# Patient Record
Sex: Female | Born: 1975 | Race: Black or African American | Hispanic: No | Marital: Single | Smoking: Never smoker
Health system: Southern US, Community
[De-identification: ages and names within clinical notes are randomized; demographics above are authoritative.]

## PROBLEM LIST (undated history)

## (undated) DIAGNOSIS — I493 Ventricular premature depolarization: Secondary | ICD-10-CM

## (undated) DIAGNOSIS — I10 Essential (primary) hypertension: Secondary | ICD-10-CM

## (undated) DIAGNOSIS — F32A Depression, unspecified: Secondary | ICD-10-CM

## (undated) DIAGNOSIS — F419 Anxiety disorder, unspecified: Secondary | ICD-10-CM

## (undated) HISTORY — PX: OTHER SURGICAL HISTORY: SHX169

---

## 1999-01-23 ENCOUNTER — Emergency Department (HOSPITAL_COMMUNITY): Admission: EM | Admit: 1999-01-23 | Discharge: 1999-01-23 | Payer: Self-pay | Admitting: *Deleted

## 1999-01-23 ENCOUNTER — Encounter: Payer: Self-pay | Admitting: *Deleted

## 2001-01-27 ENCOUNTER — Other Ambulatory Visit: Admission: RE | Admit: 2001-01-27 | Discharge: 2001-01-27 | Payer: Self-pay | Admitting: Obstetrics and Gynecology

## 2001-05-25 ENCOUNTER — Inpatient Hospital Stay (HOSPITAL_COMMUNITY): Admission: AD | Admit: 2001-05-25 | Discharge: 2001-05-25 | Payer: Self-pay | Admitting: Obstetrics and Gynecology

## 2001-05-25 ENCOUNTER — Encounter: Payer: Self-pay | Admitting: Obstetrics and Gynecology

## 2001-09-06 ENCOUNTER — Inpatient Hospital Stay (HOSPITAL_COMMUNITY): Admission: AD | Admit: 2001-09-06 | Discharge: 2001-09-06 | Payer: Self-pay | Admitting: *Deleted

## 2001-09-06 ENCOUNTER — Inpatient Hospital Stay (HOSPITAL_COMMUNITY): Admission: AD | Admit: 2001-09-06 | Discharge: 2001-09-10 | Payer: Self-pay | Admitting: Obstetrics and Gynecology

## 2002-03-10 ENCOUNTER — Other Ambulatory Visit: Admission: RE | Admit: 2002-03-10 | Discharge: 2002-03-10 | Payer: Self-pay | Admitting: Obstetrics and Gynecology

## 2002-04-20 ENCOUNTER — Emergency Department (HOSPITAL_COMMUNITY): Admission: EM | Admit: 2002-04-20 | Discharge: 2002-04-21 | Payer: Self-pay | Admitting: Emergency Medicine

## 2004-01-12 ENCOUNTER — Other Ambulatory Visit: Admission: RE | Admit: 2004-01-12 | Discharge: 2004-01-12 | Payer: Self-pay | Admitting: Obstetrics and Gynecology

## 2004-03-28 ENCOUNTER — Ambulatory Visit (HOSPITAL_COMMUNITY): Admission: RE | Admit: 2004-03-28 | Discharge: 2004-03-28 | Payer: Self-pay | Admitting: Obstetrics and Gynecology

## 2004-04-25 ENCOUNTER — Inpatient Hospital Stay (HOSPITAL_COMMUNITY): Admission: AD | Admit: 2004-04-25 | Discharge: 2004-04-25 | Payer: Self-pay | Admitting: Obstetrics and Gynecology

## 2004-04-28 ENCOUNTER — Inpatient Hospital Stay (HOSPITAL_COMMUNITY): Admission: AD | Admit: 2004-04-28 | Discharge: 2004-04-28 | Payer: Self-pay | Admitting: Obstetrics and Gynecology

## 2004-07-29 ENCOUNTER — Inpatient Hospital Stay (HOSPITAL_COMMUNITY): Admission: AD | Admit: 2004-07-29 | Discharge: 2004-07-29 | Payer: Self-pay | Admitting: Obstetrics and Gynecology

## 2004-08-06 ENCOUNTER — Inpatient Hospital Stay (HOSPITAL_COMMUNITY): Admission: RE | Admit: 2004-08-06 | Discharge: 2004-08-09 | Payer: Self-pay | Admitting: Obstetrics and Gynecology

## 2005-01-14 ENCOUNTER — Other Ambulatory Visit: Admission: RE | Admit: 2005-01-14 | Discharge: 2005-01-14 | Payer: Self-pay | Admitting: Obstetrics and Gynecology

## 2005-02-01 ENCOUNTER — Emergency Department (HOSPITAL_COMMUNITY): Admission: EM | Admit: 2005-02-01 | Discharge: 2005-02-01 | Payer: Self-pay | Admitting: Emergency Medicine

## 2008-04-04 ENCOUNTER — Encounter: Payer: Self-pay | Admitting: Family Medicine

## 2008-04-04 ENCOUNTER — Ambulatory Visit: Payer: Self-pay | Admitting: Internal Medicine

## 2008-04-04 LAB — CONVERTED CEMR LAB
ALT: 14 units/L (ref 0–35)
AST: 15 units/L (ref 0–37)
Albumin: 4.5 g/dL (ref 3.5–5.2)
Alkaline Phosphatase: 66 units/L (ref 39–117)
BUN: 9 mg/dL (ref 6–23)
Basophils Absolute: 0 10*3/uL (ref 0.0–0.1)
Basophils Relative: 1 % (ref 0–1)
CO2: 20 meq/L (ref 19–32)
Calcium: 9.6 mg/dL (ref 8.4–10.5)
Chloride: 105 meq/L (ref 96–112)
Cholesterol: 173 mg/dL (ref 0–200)
Creatinine, Ser: 0.76 mg/dL (ref 0.40–1.20)
Eosinophils Absolute: 0.2 10*3/uL (ref 0.0–0.7)
Eosinophils Relative: 3 % (ref 0–5)
Glucose, Bld: 102 mg/dL — ABNORMAL HIGH (ref 70–99)
HCT: 42.6 % (ref 36.0–46.0)
HDL: 62 mg/dL (ref >39)
Hemoglobin: 14.1 g/dL (ref 12.0–15.0)
LDL Cholesterol: 96 mg/dL (ref 0–99)
Lymphocytes Relative: 39 % (ref 12–46)
Lymphs Abs: 2.8 10*3/uL (ref 0.7–4.0)
MCHC: 33.1 g/dL (ref 30.0–36.0)
MCV: 90.4 fL (ref 78.0–100.0)
Monocytes Absolute: 0.4 10*3/uL (ref 0.1–1.0)
Monocytes Relative: 6 % (ref 3–12)
Neutro Abs: 3.8 10*3/uL (ref 1.7–7.7)
Neutrophils Relative %: 53 % (ref 43–77)
Platelets: 261 10*3/uL (ref 150–400)
Potassium: 4.5 meq/L (ref 3.5–5.3)
RBC: 4.71 M/uL (ref 3.87–5.11)
RDW: 13.6 % (ref 11.5–15.5)
Sodium: 138 meq/L (ref 135–145)
TSH: 1.054 microintl units/mL (ref 0.350–4.50)
Total Bilirubin: 0.7 mg/dL (ref 0.3–1.2)
Total CHOL/HDL Ratio: 2.8
Total Protein: 7.5 g/dL (ref 6.0–8.3)
Triglycerides: 73 mg/dL (ref <150)
VLDL: 15 mg/dL (ref 0–40)
WBC: 7.2 10*3/uL (ref 4.0–10.5)

## 2008-04-09 ENCOUNTER — Emergency Department (HOSPITAL_COMMUNITY): Admission: EM | Admit: 2008-04-09 | Discharge: 2008-04-09 | Payer: Self-pay | Admitting: Emergency Medicine

## 2008-04-19 ENCOUNTER — Ambulatory Visit: Payer: Self-pay | Admitting: Internal Medicine

## 2008-04-19 ENCOUNTER — Ambulatory Visit: Payer: Self-pay | Admitting: *Deleted

## 2008-04-19 ENCOUNTER — Encounter: Payer: Self-pay | Admitting: Family Medicine

## 2008-08-05 ENCOUNTER — Ambulatory Visit: Payer: Self-pay | Admitting: Internal Medicine

## 2008-09-30 ENCOUNTER — Ambulatory Visit: Payer: Self-pay | Admitting: Internal Medicine

## 2008-11-11 ENCOUNTER — Encounter: Payer: Self-pay | Admitting: Family Medicine

## 2008-11-11 ENCOUNTER — Ambulatory Visit: Payer: Self-pay | Admitting: Internal Medicine

## 2008-11-28 ENCOUNTER — Telehealth (INDEPENDENT_AMBULATORY_CARE_PROVIDER_SITE_OTHER): Payer: Self-pay | Admitting: *Deleted

## 2008-11-29 ENCOUNTER — Ambulatory Visit: Payer: Self-pay

## 2008-11-30 ENCOUNTER — Ambulatory Visit: Payer: Self-pay

## 2008-11-30 ENCOUNTER — Encounter: Payer: Self-pay | Admitting: Cardiology

## 2009-07-10 ENCOUNTER — Ambulatory Visit: Payer: Self-pay | Admitting: Internal Medicine

## 2009-07-24 ENCOUNTER — Ambulatory Visit: Payer: Self-pay | Admitting: Family Medicine

## 2009-08-31 ENCOUNTER — Encounter (INDEPENDENT_AMBULATORY_CARE_PROVIDER_SITE_OTHER): Payer: Self-pay | Admitting: Adult Health

## 2009-08-31 ENCOUNTER — Ambulatory Visit: Payer: Self-pay | Admitting: Internal Medicine

## 2009-08-31 LAB — CONVERTED CEMR LAB
Bilirubin Urine: NEGATIVE
Chlamydia, Swab/Urine, PCR: NEGATIVE
GC Probe Amp, Urine: NEGATIVE
Leukocytes, UA: NEGATIVE
Protein, ur: NEGATIVE mg/dL
Specific Gravity, Urine: 1.026 (ref 1.005–1.030)
Urobilinogen, UA: 0.2 (ref 0.0–1.0)

## 2009-09-01 ENCOUNTER — Encounter (INDEPENDENT_AMBULATORY_CARE_PROVIDER_SITE_OTHER): Payer: Self-pay | Admitting: Adult Health

## 2010-12-07 NOTE — H&P (Signed)
[facility] [place]  Patient:    Briana Rodgers, Briana Rodgers Visit Number: [id number] MRN: [id number]          Service Type: OBS Location: [facility] Attending Physician:  Esmeralda Arthur Dictated by:   Wynelle Bourgeois, CNM Admit Date:  09/06/2001                           History and Physical  HISTORY OF PRESENT ILLNESS:   This is a 35 year old G1, P0, at 40-5/7 weeks, who presented last night for early labor with cervix dilated to fingertip. She was given analgesia during the night and is now 3-4 cm dilated with stronger labor pattern.  Pregnancy has been followed by the nurse midwife service and remarkable for:  (1) Equivocal rubella; (2) family history of twins; (3) family history of Huntingtons chorea; (4) father of baby with HSV; (5) group B strep negative.  OBSTETRICAL HISTORY:          The patient is a primigravida.  PRENATAL LABORATORY DATA:     Hemoglobin 12.8, hematocrit 37.3, platelets 256. Blood type O positive, antibody screen negative.  Sickle cell trait negative. RPR nonreactive.  Rubella equivocal HBsAG negative.  HIV nonreactive.  Pap test normal.  Gonorrhea negative, Chlamydia negative.  AFP free beta within normal limits.  Glucose challenge within normal limits.  Group B strep negative.  PAST MEDICAL HISTORY:         Remarkable for a history of an episode of heart palpitations, which was evaluated by Dr. Clarene Duke and found to be within normal limits.  History of being lactose-intolerant.  Patient is a previous smoker. She stopped in 1997.  She also stopped alcohol and drug use at that time.  FAMILY HISTORY:               Father of the baby has HSV.  The patient has never had symptoms.  She has a mother with varicosities, a cousin with kidney failure, a maternal grandfather with unknown type of cancer.  GENETIC HISTORY:              Remarkable for Huntingtons chorea on the father of the babys side with his aunt and maternal grandfather.  History of  twins in paternal grandmother, paternal uncle, and cousin all have twins.  SOCIAL HISTORY:               The patient is single but involved with Jearld Fenton, IV, who is involved and supportive.  She is a Futures trader.  She is of the [employer].  She denies any alcohol, tobacco, or drug use currently.  PHYSICAL EXAMINATION:  VITAL SIGNS:                  Stable, afebrile.  HEENT:                        Within normal limits.  NECK:                         Thyroid normal, not enlarged.  BREASTS:                      Soft, nontender, no masses.  CHEST:                        Clear to auscultation bilaterally.  CARDIAC:  Regular rate and rhythm without murmur.  ABDOMEN:                      Gravid at 40 cm, vertex by Leopolds.  Maternal fetal monitor denotes heart rate in the 140s with average variability, positive accelerations, which meets criteria for reactivity. Uterine contractions every three minutes, which are strong.  PELVIC:                       Cervical exam is 3-4 cm, 80% effaced, and -1 station with a vertex presentation and positive bloody show.  Intact membranes.  EXTREMITIES:                  Within normal limits.  Negative Homans.  ASSESSMENT:                   1. Intrauterine pregnancy at term.                               2. Active labor.                               3. Group B strep negative.  PLAN:                         1. Admit to birthing suite, Dr. Stefano Gaul                                  notified.                               2. Routine CNM orders.                               3. Epidural p.r.n. Dictated by:   Wynelle Bourgeois, CNM Attending Physician:  Esmeralda Arthur DD:  09/07/01 TD:  09/07/01 Job: [id number]

## 2010-12-07 NOTE — Op Note (Signed)
[facility] [place]  Patient:    Briana Rodgers, Briana Rodgers Visit Number: [id number] MRN: [id number]          Service Type: OBS Location: [facility] Attending Physician:  Esmeralda Arthur Dictated by:   Janine Limbo, M.D. Proc. Date: 09/07/01 Admit Date:  09/06/2001 Discharge Date: 09/06/2001                             Operative Report  PREOPERATIVE DIAGNOSES:       1. Term intrauterine pregnancy.                               2. Nonreassuring fetal heart rate tracing.                               3. Meconium-stained amniotic fluid.  POSTOPERATIVE DIAGNOSES:      1. Term intrauterine pregnancy.                               2. Nonreassuring fetal heart rate tracing.                               3. Meconium-stained amniotic fluid.  OPERATION:                    Primary low transverse cesarean section.  SURGEON:                      Janine Limbo, M.D.  FIRST ASSISTANT:              Wynelle Bourgeois, CNM.  ANESTHESIA:                   Spinal.  DISPOSITION:                  The patient is a 35 year old female, gravida 1, para 0, who presented to [facility] in labor.  She was noted to have meconium-stained amniotic fluid.  She dilated her cervix to 4-5 cm, but progressed no further.  The patient began having late decelerations.  An amnioinfusion was given.  The patient was given oxygen and positioned from side-to-side.  The decelerations resolved initially.  The patient was started on Pitocin to augment her labor.  The decelerations returned.  The decision was made to proceed with cesarean section.  The risks and benefits of cesarean section were reviewed including, but not limited to anesthetic complications, bleeding, infection, and possible damage to the surrounding organs.  FINDINGS:                     A 6 pounds and 14 ounces female infant Kathryne Hitch). The Apgars were 8 at one minute and 9 at five minutes.  The uterus was normal for the gravid  state.  There was a nuchal cord present.  DESCRIPTION OF PROCEDURE:     The patient was taken to the operating room where it was determined that the epidural (actually a spinal) that she had received for labor would be adequate for cesarean section.  The patients abdomen was prepped with multiple layers of Betadine and then sterilely draped.  A low transverse incision was made in the  abdomen and carried sharply through the subcutaneous tissue, the fascia, and the anterior peritoneum.  An incision was made in the lower uterine segment and extended transversely.  The fetal head was delivered.  The mouth and nose were suctioned using the DeLee trap and the bulb suction.  There was no meconium noted beneath the vocal cords.  There was a nuchal cord present.  The nuchal cord was clamped and cut, and then the remainder of the infant was delivered and carried immediately to the pediatric team in attendance.  The vocal cords were not visualized because the infant did cry.  Routine cord blood studies were obtained.  The placenta was removed.  The uterine cavity was cleaned of amniotic fluid, clotted blood, and membranes.  The uterine cavity and the pelvic cavity were then vigorously irrigated.  The uterine incision was closed using a running locking suture of 2-0 Vicryl.  Hemostasis was adequate.  Again, the peritoneal cavity was irrigated.  Hemostasis was adequate.  All instruments were removed.  The anterior peritoneum and the abdominal musculature were reapproximated in the midline.  The fascia was irrigated and then closed using a running suture of 0 Vicryl followed by three interrupted sutures of 0 Vicryl.  The subcutaneous layer was irrigated and then closed using a running suture of 2-0 Vicryl.  The skin was reapproximated using skin staples.  Sponge, needle, and instrument counts were correct on two occasions.  The estimated blood loss for the procedure was 700 cc.  The patient tolerated  her procedure well.  The patient was taken to the recovery room in stable condition.  The infant was taken to the full-term nursery in stable condition. Dictated by:   Janine Limbo, M.D. Attending Physician:  Esmeralda Arthur DD:  09/07/01 TD:  09/08/01 Job: [id number]

## 2010-12-07 NOTE — Discharge Summary (Signed)
[facility]  Patient:    Briana Rodgers, Briana Rodgers Visit Number: [id number] MRN: [id number]          Service Type: Attending:  Silverio Lay, M.D. Dictated by:   Wynelle Bourgeois, CNM Adm. Date:  09/07/01 Disc. Date: 09/10/01                             Discharge Summary  ADMITTING DIAGNOSES:          1. Intrauterine pregnancy at 40-5/7th weeks.                               2. Active labor.                               3. Group B strep negative.  DISCHARGE DIAGNOSES:          1. Intrauterine pregnancy at 40-5/7th weeks.                               2. Active labor.                               3. Group B strep negative.                               4. Meconium stained amniotic fluid.                               5. Nonreassuring fetal heart rate tracing.  PROCEDURES:                   1. Spinal anesthesia.                               2. Primary low transverse cesarean section for                                  a viable female infant named Lundyn weighing                                  at 6 pounds 4 ounces.  Apgars 8 and 9.  HOSPITAL COURSE:              The patient was admitted for active labor at 3 to 4 cm.  She began having spontaneous variable decelerations down to the 60s with return to baseline after position changes.  IUPC and ISC were inserted.  Amniotomy was performed for thick, meconium stained amniotic fluid and amniotic fusion was begun.  Cervix progressed to 4 cm, 90%, -1 station.  The patient received continuous spinal anesthesia for pain management and continued to be observed.  She continued to experience variable decelerations and developed late decelerations and progressed her cervix to 4 to 5 cm.  Observation was undertaken for 30 minutes and she continued to have a nonreassuring fetal heart rate tracing and no change in her cervical exam.  Options  were discussed with the patient and her family and decision was made to proceed  with an elective low transverse cesarean section which was performed by Dr. Janine Limbo under spinal anesthesia.  Estimated blood loss was 700 cc.  The patient and infant did well.  On postoperative day one, she was recovering well and had good pain relief with medications.  On postoperative day three, she was doing well, breast-feeding.  The small lochia incision was dry.  Lungs were clear.  Heart rate was regular rate and rhythm and she was deemed to have received the full benefit of her hospital stay and was discharged home on postoperative day three.  DISCHARGE MEDICATIONS:        1. Motrin 600 mg p.o. q.6h. p.r.n.                               2. Tylox 1-2 p.o. q.3-4h. p.r.n.  LABORATORY DATA:              White blood cell count 14.5, hemoglobin 10.9, hematocrit 32.3, platelets 181,000.  DISCHARGE INSTRUCTIONS:       Per [facility] OB handout.  Discharge follow-up in four weeks for cultures and then two weeks later for IUD insertion or p.r.n. as indicated. Dictated by:   Wynelle Bourgeois, CNM Attending:  Silverio Lay, M.D. DD:  09/10/01 TD:  09/10/01 Job: [id number]

## 2010-12-07 NOTE — Op Note (Signed)
NAMEKANDISS, IHRIG                  ACCOUNT NO.:  [id number]   MEDICAL RECORD NO.:  [id number]          PATIENT TYPE:  INP   LOCATION:  9135                          FACILITY:  [facility]   PHYSICIAN:  Hal Morales, M.D.DATE OF BIRTH:  [birth year]   DATE OF PROCEDURE:  08/06/2004  DATE OF DISCHARGE:                                 OPERATIVE REPORT   PREOPERATIVE DIAGNOSES:  1.  Intrauterine pregnancy at term.  2.  Prior cesarean section, desire for repeat cesarean section.   POSTOPERATIVE DIAGNOSES:  1.  Intrauterine pregnancy at term.  2.  Prior cesarean section, desire for repeat cesarean section.   OPERATION:  Repeat low transverse cesarean section.   SURGEON:  Hal Morales, M.D.   FIRST ASSISTANT:  Saverio Danker, certified nurse midwife.   ANESTHESIA:  Spinal.   ESTIMATED BLOOD LOSS:  750 cc.   COMPLICATIONS:  None.   FINDINGS:  The uterus, tubes and ovaries were normal for the gravid state.  The patient was delivered of a female infant, whose name is Merlyn Albert, weighing 8  pounds 10 ounces, with Apgars of 7 and 9 at one and five minutes,  respectively.   PROCEDURE:  The patient is taken to the operating room after appropriate  identification and placed on the operating table.  After placement of a  spinal anesthetic, she was placed in the supine position with a left lateral  tilt.  The abdomen and perineum were prepped with multiple layers with  Betadine and a Foley catheter inserted into the bladder and connected to  straight drainage.  After assurance of adequate anesthesia, the suprapubic  region was infiltrated with a total of 20 cc of 0.25% Marcaine.  The uterine  incision was made at the site of the previous cesarean section incision and  the abdomen opened in layers.  The peritoneum was entered and the bladder  blade placed.  The uterine serosa was incised and that incision taken  laterally on either side sharply.  The bladder was bluntly dissected off the  anterior uterus and the uterine incision made in the low transverse  position, then extended bluntly to either side.  The membranes were ruptured  with clear fluid and a small amount of particulate meconium.  The infant was  then delivered from the occiput transverse position with the aid of a Kiwi  vacuum extractor and after having the nares and pharynx suctioned and the  cord clamped and cut was handed off to the awaiting pediatricians.  The  appropriate cord blood was drawn and the placenta noted to have separated  from the uterus and was removed from the operative field.  The uterine  incision was closed with a running interlocking suture of 0 Vicryl.  An  imbricating suture of 0 Vicryl was placed and hemostasis noted to be  adequate.  Copious irrigation was carried out.  The abdominal peritoneum was  closed with a running suture of 2-0 Vicryl.  The rectus muscles were  reapproximated in the midline with figure-of-eight suture of 2-0 Vicryl.  The rectus muscles were made hemostatic  with Bovie cautery and irrigated.  The rectus fascia was closed with a running suture of 0 Vicryl, then  reinforced on either side of midline with figure-of-eight sutures of 0  Vicryl.  The subcutaneous tissue was copiously irrigated and made hemostatic  with Bovie cautery.  A subcutaneous Jackson-Pratt drain was placed through a  stab wound in the left lower quadrant and tied in with a suture of 2-0 silk.  The skin incision was closed with a subcuticular suture of 3-0 Monocryl.  A  sterile dressing was applied to the incision and the patient taken from the  operating room to the recovery room in satisfactory condition, having  tolerated the procedure well with sponge and instrument counts correct.  The  infant went to the full-term nursery.     Colin Ina   VPH/MEDQ  D:  08/06/2004  T:  08/06/2004  Job:  [id number]

## 2010-12-07 NOTE — Discharge Summary (Signed)
Briana Rodgers                  ACCOUNT NO.:  [id number]   MEDICAL RECORD NO.:  [id number]          PATIENT TYPE:  INP   LOCATION:  9135                          FACILITY:  [facility]   PHYSICIAN:  Hal Morales, M.D.DATE OF BIRTH:  16-Jan-1976   DATE OF ADMISSION:  08/06/2004  DATE OF DISCHARGE:  08/09/2004                                 DISCHARGE SUMMARY   ADMISSION DIAGNOSES:  1.  Intrauterine pregnancy at term.  2.  Previous cesarean section.  3.  Desiring repeat.   DISCHARGE DIAGNOSES:  1.  Intrauterine pregnancy at term.  2.  Previous cesarean section.  3.  Desiring repeat.  4.  Breast-feeding undecided.   PLAN:  Oral contraceptives for contraception.   PROCEDURES THIS ADMISSION:  1.  Repeat low transverse cesarean section for delivery of a viable female      infant named Briana Rodgers, who had Apgars 7 and 9 and weighed 8 pounds 10 ounces      on August 06, 2004, attended in delivery by Dr. Dierdre Forth and      Saverio Danker, C.N.M.   HOSPITAL COURSE:  Briana Rodgers is a 35 year old single black female, gravida 2,  para 1-0-0-1, at term admitted for elective repeat cesarean section  secondary to history of previous cesarean section and underwent the same  without complication for delivery of a viable female infant named Briana Rodgers, who  weighed 8 pounds 10 ounces and had Apgars of 7 and 9 on August 06, 2004,  attended in delivery by Dr. Dierdre Forth and Vance Gather Duplantis, C.N.M.  Please see operative note for further details.   Postoperatively, the patient is doing well.  She is ambulating, voiding, and  eating without difficulty.  Her vital signs are stable and she has been  afebrile throughout her postoperative course.  She is breast-feeding also  without difficulty.  She plans oral contraceptives for birth control at this  time, though is considering the patch for later on.  She is deemed ready for  discharge today.  Her discharge instructions are as per the [facility]  OB/GYN hand-out.   DISCHARGE MEDICATIONS:  1.  Motrin 600 mg p.o. q.6h. p.r.n. pain.  2.  Tylox 1-2 p.o. q.4-6h. p.r.n. pain.  3.  Prenatal vitamins daily.   DISCHARGE LABORATORY:  Hemoglobin 12.2, WBC 11.8, platelets 205.   DISCHARGE FOLLOWUP:  In 4-6 weeks at [facility] or p.r.n.   DISCHARGE STATUS:  Well and stable.     Shel   SJD/MEDQ  D:  08/09/2004  T:  08/09/2004  Job:  [id number]

## 2010-12-07 NOTE — H&P (Signed)
Briana Rodgers, BALLMAN NO.:  [id number]   MEDICAL RECORD NO.:  [id number]          PATIENT TYPE:  INP   LOCATION:  9135                          FACILITY:  [facility]   PHYSICIAN:  Hal Morales, M.D.DATE OF BIRTH:  11-10-1975   DATE OF ADMISSION:  08/06/2004  DATE OF DISCHARGE:                                HISTORY & PHYSICAL   Ms. Briana Rodgers is a 35 year old single black female, gravida 2, para 1-0-0-1 at  term who presents for elective repeat Cesarean section. She denies any  leaking or vaginal bleeding. She denies any nausea, vomiting, headache, or  visual disturbances. Her pregnancy has been followed at [facility] and has been essentially uncomplicated thought at risk for previous  Cesarean section and originally desiring trial of labor but now desiring  elective repeat Cesarean section. 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   OBSTETRICAL/GYNECOLOGICAL HISTORY:  She is a gravida 2, para 1-0-0-1 who  delivered a viable female infant in February of 2003 who weighed 6 pounds 4  ounces at [redacted] weeks gestation. She had a primary low transverse Cesarean  section for that delivery secondary to nonreassuring fetal heart rate,  meconium stained fluid, and failure to progress beyond 4 cm.   GENERAL MEDICAL HISTORY:  She is allergic to SULFA. She reports having had  the usual childhood diseases. She reports a history of lactose intolerance,  history of smoking in the past but discontinued that in 1997, and she also  has a history of occasional palpitations, but her workup was within normal  limits.   FAMILY HISTORY:  Significant for mom with varicosities, maternal grandfather  with some kind of cancer, and a cousin on dialysis. Her genetic history is  negative, other there is a family history of Huntington's chorea.   SOCIAL HISTORY:  She is single. The father of the baby is Jearld Fenton IV who  is involved and  supportive. 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 They denied illicit drug use, alcohol, or  smoking with this pregnancy.   PRENATAL LABORATORY DATA:  Her blood type is O positive. Her antibody screen  is negative. Sickle cell trait is negative. Syphilis is nonreactive. Rubella  is immune. Hepatitis B surface antigen is negative. HIV is negative. Cystic  fibrosis is negative. 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 GC and Chlamydia are both negative, and her Pap was within normal  limits in June of 2005. Her 1-hour Glucola was within normal range at 75,  and 36-week beta strep was negative.   PHYSICAL EXAMINATION:  VITAL SIGNS:  Her vital signs are stable. She is  afebrile.  HEENT:  Grossly within normal limits.  HEART:  Regular rate and rhythm.  CHEST:  Clear.  BREASTS:  Soft and nontender.  ABDOMEN:  Gravid with irregular mild uterine contractions. Fetal heart rate  is dopplered within the normal range.  PELVIC:  On Friday, her cervix was long and closed.  EXTREMITIES:  Within normal limits.   ASSESSMENT:  1.  Intrauterine pregnancy at term.  2.  Previous Cesarean section desiring repeat.   PLAN:  Admit to [facility] for repeat Cesarean section per Dr. Dierdre Forth.     Shel   SJD/MEDQ  D:  08/06/2004  T:  08/06/2004  Job:  [id number]

## 2012-09-13 ENCOUNTER — Emergency Department (HOSPITAL_COMMUNITY): Payer: BC Managed Care – PPO

## 2012-09-13 ENCOUNTER — Emergency Department (HOSPITAL_COMMUNITY)
Admission: EM | Admit: 2012-09-13 | Discharge: 2012-09-13 | Disposition: A | Discharge status: Home / Self Care | Payer: BC Managed Care – PPO | Attending: Emergency Medicine | Admitting: Emergency Medicine

## 2012-09-13 DIAGNOSIS — Z79899 Other long term (current) drug therapy: Secondary | ICD-10-CM | POA: Insufficient documentation

## 2012-09-13 DIAGNOSIS — S20219A Contusion of unspecified front wall of thorax, initial encounter: Secondary | ICD-10-CM | POA: Insufficient documentation

## 2012-09-13 DIAGNOSIS — Y92410 Unspecified street and highway as the place of occurrence of the external cause: Secondary | ICD-10-CM | POA: Insufficient documentation

## 2012-09-13 DIAGNOSIS — Y9389 Activity, other specified: Secondary | ICD-10-CM | POA: Insufficient documentation

## 2012-09-13 LAB — POCT I-STAT TROPONIN I: Troponin i, poc: 0 ng/mL (ref 0.00–0.08)

## 2012-09-13 LAB — PREGNANCY, URINE: Preg Test, Ur: NEGATIVE

## 2012-09-13 MED ORDER — IBUPROFEN 600 MG PO TABS: 600.0000 mg | ORAL_TABLET | Freq: Four times a day (QID) | ORAL | Status: DC | PRN

## 2012-09-13 MED ORDER — METHOCARBAMOL 500 MG PO TABS: 500.0000 mg | ORAL_TABLET | Freq: Two times a day (BID) | ORAL | Status: DC

## 2012-09-13 MED ORDER — MORPHINE SULFATE 4 MG/ML IJ SOLN: 4.0000 mg | Freq: Once | INTRAMUSCULAR | Status: DC

## 2012-09-13 MED ORDER — HYDROCODONE-ACETAMINOPHEN 5-325 MG PO TABS: 1.0000 | ORAL_TABLET | Freq: Four times a day (QID) | ORAL | Status: DC | PRN

## 2012-09-13 MED ORDER — OXYCODONE-ACETAMINOPHEN 5-325 MG PO TABS
1.0000 | ORAL_TABLET | Freq: Once | ORAL | Status: AC
  Administered 2012-09-13: 1 via ORAL
  Filled 2012-09-13: qty 1

## 2012-09-13 NOTE — ED Notes (Signed)
PT denies pain to neck/back. Denies LOC. Reports 6/10 right sided pain to chest that "hurts when I take a deep breath." AO x 4.

## 2012-09-13 NOTE — ED Notes (Addendum)
Pt ambulated with no difficulty
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
## 2012-09-13 NOTE — ED Notes (Signed)
MD Nanavati at the bedside  
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
## 2012-09-14 ENCOUNTER — Other Ambulatory Visit: Payer: Self-pay

## 2015-03-20 ENCOUNTER — Other Ambulatory Visit: Payer: Self-pay | Admitting: Surgical Oncology

## 2015-03-20 DIAGNOSIS — K219 Gastro-esophageal reflux disease without esophagitis: Secondary | ICD-10-CM

## 2015-03-28 ENCOUNTER — Other Ambulatory Visit: Payer: Self-pay

## 2015-04-06 ENCOUNTER — Ambulatory Visit
Admission: RE | Admit: 2015-04-06 | Discharge: 2015-04-06 | Disposition: A | Discharge status: Home / Self Care | Payer: BLUE CROSS/BLUE SHIELD | Source: Ambulatory Visit | Attending: Surgical Oncology | Admitting: Surgical Oncology

## 2015-04-06 DIAGNOSIS — K219 Gastro-esophageal reflux disease without esophagitis: Secondary | ICD-10-CM

## 2015-04-07 ENCOUNTER — Other Ambulatory Visit: Payer: Self-pay

## 2017-02-13 ENCOUNTER — Ambulatory Visit (HOSPITAL_COMMUNITY)
Admission: EM | Admit: 2017-02-13 | Discharge: 2017-02-13 | Disposition: A | Discharge status: Home / Self Care | Payer: BLUE CROSS/BLUE SHIELD

## 2017-02-13 ENCOUNTER — Encounter (HOSPITAL_COMMUNITY): Payer: Self-pay

## 2017-02-13 DIAGNOSIS — M7918 Myalgia, other site: Secondary | ICD-10-CM

## 2017-02-13 DIAGNOSIS — S161XXA Strain of muscle, fascia and tendon at neck level, initial encounter: Secondary | ICD-10-CM

## 2017-02-13 DIAGNOSIS — M791 Myalgia: Secondary | ICD-10-CM

## 2017-02-13 HISTORY — DX: Essential (primary) hypertension: I10

## 2017-02-13 MED ORDER — CYCLOBENZAPRINE HCL 10 MG PO TABS: 10.0000 mg | ORAL_TABLET | Freq: Two times a day (BID) | ORAL | 0 refills | Status: DC | PRN

## 2017-02-13 MED ORDER — DICLOFENAC SODIUM 75 MG PO TBEC: 75.0000 mg | DELAYED_RELEASE_TABLET | Freq: Two times a day (BID) | ORAL | 0 refills | Status: DC
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
## 2017-02-13 NOTE — Discharge Instructions (Signed)
You most likely have a strained muscle in your shoulder and neck. I have prescribed two medicines for your pain. The first is diclofenac, take 1 tablet twice a day and the other is Robaxin, take 1 tablet twice a day. Robaxin may cause drowsiness so do not drive until you know how this medicine affects you. Also do not drink any alcohol either. You may apply ice and alternate with heat for 15 minutes at a time 4 times daily and for additional pain control you may take tylenol over the counter ever 4 hours but do not take more than 4000 mg a day. Should your pain continue or fail to resolve, follow up with your primary care provider or return to clinic as needed.

## 2017-02-24 ENCOUNTER — Other Ambulatory Visit (HOSPITAL_COMMUNITY): Payer: Self-pay | Admitting: Internal Medicine

## 2017-02-24 ENCOUNTER — Ambulatory Visit (HOSPITAL_COMMUNITY)
Admission: RE | Admit: 2017-02-24 | Discharge: 2017-02-24 | Disposition: A | Discharge status: Home / Self Care | Payer: No Typology Code available for payment source | Source: Ambulatory Visit | Attending: Internal Medicine | Admitting: Internal Medicine

## 2017-02-24 DIAGNOSIS — M545 Low back pain: Secondary | ICD-10-CM

## 2017-02-24 DIAGNOSIS — M25511 Pain in right shoulder: Secondary | ICD-10-CM | POA: Diagnosis present

## 2017-02-24 DIAGNOSIS — M542 Cervicalgia: Secondary | ICD-10-CM

## 2017-02-24 DIAGNOSIS — M47812 Spondylosis without myelopathy or radiculopathy, cervical region: Secondary | ICD-10-CM | POA: Insufficient documentation

## 2017-02-24 DIAGNOSIS — M5137 Other intervertebral disc degeneration, lumbosacral region: Secondary | ICD-10-CM | POA: Diagnosis not present

## 2017-02-24 DIAGNOSIS — M50322 Other cervical disc degeneration at C5-C6 level: Secondary | ICD-10-CM | POA: Insufficient documentation
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
## 2018-10-15 DIAGNOSIS — F419 Anxiety disorder, unspecified: Secondary | ICD-10-CM | POA: Diagnosis not present

## 2018-10-15 DIAGNOSIS — E785 Hyperlipidemia, unspecified: Secondary | ICD-10-CM | POA: Diagnosis not present

## 2018-10-15 DIAGNOSIS — I10 Essential (primary) hypertension: Secondary | ICD-10-CM | POA: Diagnosis not present

## 2018-10-15 DIAGNOSIS — R002 Palpitations: Secondary | ICD-10-CM | POA: Diagnosis not present

## 2018-10-30 DIAGNOSIS — B373 Candidiasis of vulva and vagina: Secondary | ICD-10-CM | POA: Diagnosis not present

## 2019-03-22 DIAGNOSIS — Z79899 Other long term (current) drug therapy: Secondary | ICD-10-CM | POA: Diagnosis not present

## 2019-03-22 DIAGNOSIS — R51 Headache: Secondary | ICD-10-CM | POA: Diagnosis not present

## 2019-03-22 DIAGNOSIS — Z0001 Encounter for general adult medical examination with abnormal findings: Secondary | ICD-10-CM | POA: Diagnosis not present

## 2019-03-22 DIAGNOSIS — I10 Essential (primary) hypertension: Secondary | ICD-10-CM | POA: Diagnosis not present

## 2019-04-12 DIAGNOSIS — Z0001 Encounter for general adult medical examination with abnormal findings: Secondary | ICD-10-CM | POA: Diagnosis not present

## 2019-04-12 DIAGNOSIS — E785 Hyperlipidemia, unspecified: Secondary | ICD-10-CM | POA: Diagnosis not present

## 2019-04-12 DIAGNOSIS — Z79899 Other long term (current) drug therapy: Secondary | ICD-10-CM | POA: Diagnosis not present

## 2019-04-12 DIAGNOSIS — E559 Vitamin D deficiency, unspecified: Secondary | ICD-10-CM | POA: Diagnosis not present

## 2019-06-28 DIAGNOSIS — Z20828 Contact with and (suspected) exposure to other viral communicable diseases: Secondary | ICD-10-CM | POA: Diagnosis not present

## 2019-07-30 DIAGNOSIS — Z20828 Contact with and (suspected) exposure to other viral communicable diseases: Secondary | ICD-10-CM | POA: Diagnosis not present

## 2019-08-12 DIAGNOSIS — Z03818 Encounter for observation for suspected exposure to other biological agents ruled out: Secondary | ICD-10-CM | POA: Diagnosis not present

## 2019-08-12 DIAGNOSIS — Z20828 Contact with and (suspected) exposure to other viral communicable diseases: Secondary | ICD-10-CM | POA: Diagnosis not present

## 2019-09-13 DIAGNOSIS — E559 Vitamin D deficiency, unspecified: Secondary | ICD-10-CM | POA: Diagnosis not present

## 2019-09-13 DIAGNOSIS — I10 Essential (primary) hypertension: Secondary | ICD-10-CM | POA: Diagnosis not present

## 2019-09-13 DIAGNOSIS — J209 Acute bronchitis, unspecified: Secondary | ICD-10-CM | POA: Diagnosis not present

## 2019-09-13 DIAGNOSIS — F419 Anxiety disorder, unspecified: Secondary | ICD-10-CM | POA: Diagnosis not present

## 2019-10-13 DIAGNOSIS — Z03818 Encounter for observation for suspected exposure to other biological agents ruled out: Secondary | ICD-10-CM | POA: Diagnosis not present

## 2019-10-13 DIAGNOSIS — Z20828 Contact with and (suspected) exposure to other viral communicable diseases: Secondary | ICD-10-CM | POA: Diagnosis not present

## 2019-12-27 DIAGNOSIS — I10 Essential (primary) hypertension: Secondary | ICD-10-CM | POA: Diagnosis not present

## 2019-12-27 DIAGNOSIS — F419 Anxiety disorder, unspecified: Secondary | ICD-10-CM | POA: Diagnosis not present

## 2019-12-27 DIAGNOSIS — E669 Obesity, unspecified: Secondary | ICD-10-CM | POA: Diagnosis not present

## 2019-12-27 DIAGNOSIS — Z79899 Other long term (current) drug therapy: Secondary | ICD-10-CM | POA: Diagnosis not present

## 2020-02-21 DIAGNOSIS — R5383 Other fatigue: Secondary | ICD-10-CM | POA: Diagnosis not present

## 2020-02-21 DIAGNOSIS — Z79899 Other long term (current) drug therapy: Secondary | ICD-10-CM | POA: Diagnosis not present

## 2020-02-21 DIAGNOSIS — R1013 Epigastric pain: Secondary | ICD-10-CM | POA: Diagnosis not present

## 2020-02-21 DIAGNOSIS — M2550 Pain in unspecified joint: Secondary | ICD-10-CM | POA: Diagnosis not present

## 2020-04-03 DIAGNOSIS — E559 Vitamin D deficiency, unspecified: Secondary | ICD-10-CM | POA: Diagnosis not present

## 2020-04-03 DIAGNOSIS — Z0001 Encounter for general adult medical examination with abnormal findings: Secondary | ICD-10-CM | POA: Diagnosis not present

## 2020-04-03 DIAGNOSIS — R5383 Other fatigue: Secondary | ICD-10-CM | POA: Diagnosis not present

## 2020-04-03 DIAGNOSIS — I10 Essential (primary) hypertension: Secondary | ICD-10-CM | POA: Diagnosis not present

## 2020-05-07 NOTE — Progress Notes (Signed)
   Subjective:    Patient ID: Briana Rodgers, female    DOB: [birth year], 44 y.o.   MRN: [id number]   CC: Establish care  HPI:  Briana Rodgers is a very pleasant 44 y.o. female who presents today to establish care.  Initial concerns: Coughing for few months. Has tried nyquil which has been helping the mucus. No fevers, no other symptoms, feels likes it's maybe getting better but also has a history of seasonal allergies. Does smoke marijuana 2-3x per week.  Past medical history: Anxiety/depression 2015, acid reflux 2018, hypertension 2012, migraines 2012.  Past surgical history: C-section in 2003, 2006  Current medications: Spironolactone, atenolol, alprazolam only at night on occasion, vitamin D, ibuprofen  Family history: Mother - kidney cancer, htn, no other family history  Social history: Does exercise regularly, occasional wine, marijuana 2-3x per week.  ROS: pertinent noted in the HPI   Objective:  BP 112/80   Pulse 78   Ht 5\' 3"  (1.6 m)   Wt 289 lb 2 oz (131.1 kg)   LMP 04/24/2020   SpO2 100%   BMI 51.22 kg/m   Vitals and nursing note reviewed  General: NAD, pleasant, able to participate in exam, no pharyngeal erythema, no congestion noted in patient's nose. Cardiac: RRR, no murmurs noted on exam Respiratory: CTAB, normal effort, No wheezes, rales or rhonchi Abdomen: Bowel sounds present, non-tender   Assessment & Plan:    Cough Cough lasting for few months.  No other symptoms.  No fevers, no runny nose.  Patient does have a history of seasonal allergies and is not sure if she has had any postnasal drip which may be triggering it.  Patient also states that she smokes marijuana 2-3 times per week and has done so for several years.  Physical exam with lungs clear to auscultation, no congestion noted in nose, no pharyngeal erythema. Plan: -Discussed with patient that her symptoms sound consistent with an irritant which can include environmental items such as seasonal  allergens, can be aggravated by postnasal drip, or may be caused by the marijuana smoke.  Discussed with patient home reducing her marijuana usage.  Patient plans to scale back to his marijuana usage and see if this improves her symptoms.  She also plans to see if the change in seasons makes her symptoms improve. -Discussed return precautions, patient plans to follow-up if her symptoms do not improve in the next few weeks after making the above change.  Most recent Pap smear in 2019  Health maintenance: We will check lipid panel, A1c to screen for diabetes, hepatitis C to screen for hep C.  2020, DO [facility] PGY-1

## 2020-05-07 NOTE — Patient Instructions (Signed)
It was great to meet you!  Our plans for today:  -We are checking some lab work for your cholesterol, screen for A1c, and screening for hepatitis C. -For the swelling in your legs I recommend trying compression socks, 20-30 mmHg, which can be purchased on [employer] or at medical supply stores.  Also continue to elevate your feet in the evening. -I will let you know your lab results when they return.  Do not hesitate to reach out to me if you have any further concerns.  Take care and seek immediate care sooner if you develop any concerns.   Dr. [facility]

## 2020-05-08 ENCOUNTER — Ambulatory Visit (INDEPENDENT_AMBULATORY_CARE_PROVIDER_SITE_OTHER): Payer: BC Managed Care – PPO | Admitting: Family Medicine

## 2020-05-08 ENCOUNTER — Other Ambulatory Visit: Payer: Self-pay

## 2020-05-08 ENCOUNTER — Encounter: Payer: Self-pay | Admitting: Family Medicine

## 2020-05-08 VITALS — BP 112/80 | HR 78 | Ht 63.0 in | Wt 289.1 lb

## 2020-05-08 DIAGNOSIS — Z131 Encounter for screening for diabetes mellitus: Secondary | ICD-10-CM | POA: Diagnosis not present

## 2020-05-08 DIAGNOSIS — I10 Essential (primary) hypertension: Secondary | ICD-10-CM | POA: Diagnosis not present

## 2020-05-08 DIAGNOSIS — Z13220 Encounter for screening for lipoid disorders: Secondary | ICD-10-CM

## 2020-05-08 DIAGNOSIS — Z1159 Encounter for screening for other viral diseases: Secondary | ICD-10-CM | POA: Diagnosis not present

## 2020-05-08 DIAGNOSIS — R059 Cough, unspecified: Secondary | ICD-10-CM | POA: Insufficient documentation

## 2020-05-08 NOTE — Assessment & Plan Note (Signed)
Cough lasting for few months.  No other symptoms.  No fevers, no runny nose.  Patient does have a history of seasonal allergies and is not sure if she has had any postnasal drip which may be triggering it.  Patient also states that she smokes marijuana 2-3 times per week and has done so for several years.  Physical exam with lungs clear to auscultation, no congestion noted in nose, no pharyngeal erythema. Plan: -Discussed with patient that her symptoms sound consistent with an irritant which can include environmental items such as seasonal allergens, can be aggravated by postnasal drip, or may be caused by the marijuana smoke.  Discussed with patient home reducing her marijuana usage.  Patient plans to scale back to his marijuana usage and see if this improves her symptoms.  She also plans to see if the change in seasons makes her symptoms improve. -Discussed return precautions, patient plans to follow-up if her symptoms do not improve in the next few weeks after making the above change.

## 2020-05-09 LAB — LIPID PANEL
Chol/HDL Ratio: 3.2 ratio (ref 0.0–4.4)
Cholesterol, Total: 168 mg/dL (ref 100–199)
HDL: 53 mg/dL (ref >39)
LDL Chol Calc (NIH): 102 mg/dL — ABNORMAL HIGH (ref 0–99)
Triglycerides: 67 mg/dL (ref 0–149)
VLDL Cholesterol Cal: 13 mg/dL (ref 5–40)

## 2020-05-09 LAB — HEMOGLOBIN A1C
Est. average glucose Bld gHb Est-mCnc: 128 mg/dL
Hgb A1c MFr Bld: 6.1 % — ABNORMAL HIGH (ref 4.8–5.6)

## 2020-05-09 LAB — HEPATITIS C ANTIBODY: Hep C Virus Ab: <0.1 s/co ratio (ref 0.0–0.9)

## 2020-05-11 NOTE — Addendum Note (Signed)
Addended by: Jackelyn Poling on: 05/11/2020 03:59 PM   Modules accepted: Kipp Brood

## 2020-05-11 NOTE — Addendum Note (Signed)
Addended by: Jackelyn Poling on: 05/11/2020 03:38 PM   Modules accepted: Kipp Brood

## 2020-07-20 ENCOUNTER — Ambulatory Visit: Payer: BC Managed Care – PPO | Admitting: Family Medicine

## 2020-07-23 NOTE — Patient Instructions (Signed)
It was great to see you! Thank you for allowing me to participate in your care!  Our plans for today:  -Today we discussed your symptoms of anxiety.  I am prescribing a medication called Zoloft that you will start taking on a daily basis.  I will reach out to you in about a week to see how you are doing on this medication.  I would like you to make a follow-up appointment with me in about 4 weeks to ensure that you are having significant improvement, if you are not we can consider doubling the dose of this medication as we are starting with a very small dose. -It is important to note that this medication can take 4 to 8 weeks to hit maximum effectiveness at a set dose, so it can take some time to get a dramatic reduction in your symptoms but I do believe this medication will help you in the long-term.  If you have any questions or develop any further concerns do not hesitate to reach out to me!  Thank you for choosing family practice,  Dr. Jackelyn Poling

## 2020-07-23 NOTE — Progress Notes (Signed)
Virtual Visit via Video Note  I attempted connected with Briana Rodgers on 07/24/20 at  1:50 PM EST by a video enabled telemedicine application however due to technical difficulties after 5 attempts this was discontinued and I called the patient via her cell phone.  I verified that I am speaking with the correct person using two identifiers.  Location: Patient: Home Provider: [facility]   I discussed the limitations of evaluation and management by telemedicine and the availability of in person appointments. The patient expressed understanding and agreed to proceed.  History of Present Illness:  "Discuss anxiety": Patient is a 45 year old female that presents today to discuss concerns for anxiety.  Patient is currently taking xanax 0.25mg  at bedtime as needed for anxiety for which she uses occasionally. She states she is interested in trying to change to a "better" long term medication. She states that the bulk of her anxiety comes from work.  Patient states that she denies any thoughts of suicidal or homicidal ideation.  She states that over the past several weeks she has felt a "feeling of impending doom" due to stressors related primarily to her work.  She states that at this time she is interested in trying to start a medication to assist with her depression.    Observations/Objective: GAD-7 score of 13 today.  No respiratory distress noted on phone call  Assessment and Plan: Assessment: 45 year old female with generalized anxiety which seem to be mostly prompted by her work.  Patient does not have any symptoms of SI/HI and I do feel she would be appropriate for starting medication such as Zoloft to help with her anxiety.  I spoke in detail about this medication and about the fact that it can take 4 to 8 weeks for her to hit maximum efficacy and that we would be starting at a low dose and so it may take a little bit longer for [place] to get to a efficacious dose for her.  We discussed the adverse effects  that are potential with this medication including diarrhea which can be possible particularly early on in the medication and a small but possible risk of reduced libido.  I explained the patient that should either the side effects become problematic we can always attempt another medication but that as the side effects are only a minority of the individuals taking this medication I think it is worth giving it a try. Plan:-We will start Zoloft 25 mg/day for anxiety -We will call patient in 1 week to ensure she is doing well with her anxiety -Patient states that she is no longer taking her Xanax and does not intend to try to refill it or continue taking it - Patient plans make a follow-up appointment with me in about 4 weeks.  Follow Up Instructions:    I discussed the assessment and treatment plan with the patient. The patient was provided an opportunity to ask questions and all were answered. The patient agreed with the plan and demonstrated an understanding of the instructions.   The patient was advised to call back or seek an in-person evaluation if the symptoms worsen or if the condition fails to improve as anticipated.  I provided 25 minutes of non-face-to-face time during this encounter.   Jackelyn Poling, DO

## 2020-07-24 ENCOUNTER — Telehealth (INDEPENDENT_AMBULATORY_CARE_PROVIDER_SITE_OTHER): Payer: BC Managed Care – PPO | Admitting: Family Medicine

## 2020-07-24 ENCOUNTER — Encounter: Payer: Self-pay | Admitting: Family Medicine

## 2020-07-24 DIAGNOSIS — F419 Anxiety disorder, unspecified: Secondary | ICD-10-CM

## 2020-07-24 MED ORDER — ATENOLOL 25 MG PO TABS: 50.0000 mg | ORAL_TABLET | Freq: Every day | ORAL | 1 refills | Status: DC

## 2020-07-24 MED ORDER — SERTRALINE HCL 25 MG PO TABS: 25.0000 mg | ORAL_TABLET | Freq: Every day | ORAL | 1 refills | Status: DC

## 2020-07-28 ENCOUNTER — Encounter: Payer: Self-pay | Admitting: Family Medicine

## 2020-07-31 ENCOUNTER — Telehealth: Payer: Self-pay | Admitting: Family Medicine

## 2020-07-31 NOTE — Telephone Encounter (Signed)
Called patient to see how she was doing after starting medication for anxiety.  Patient states that she has had several days off of work and she overall feels better.  She does not have any suicidal or homicidal ideation.  She is not complaining of any adverse effects currently from the medication, though she does state she had some diarrhea the first couple of days of taking it which has since resolved.  Patient states she plans to make a follow-up appointment with me in about 1 month as previously discussed to determine if we should adjust her dosing or not.  Patient has no complaints or concerns at this time.

## 2020-08-16 ENCOUNTER — Other Ambulatory Visit: Payer: Self-pay | Admitting: Family Medicine

## 2020-08-17 ENCOUNTER — Other Ambulatory Visit: Payer: Self-pay

## 2020-08-17 ENCOUNTER — Encounter: Payer: Self-pay | Admitting: Family Medicine

## 2020-08-17 ENCOUNTER — Encounter (HOSPITAL_COMMUNITY): Payer: Self-pay

## 2020-08-17 ENCOUNTER — Ambulatory Visit (HOSPITAL_COMMUNITY)
Admission: EM | Admit: 2020-08-17 | Discharge: 2020-08-17 | Disposition: A | Discharge status: Home / Self Care | Payer: BC Managed Care – PPO | Attending: Family Medicine | Admitting: Family Medicine

## 2020-08-17 DIAGNOSIS — R519 Headache, unspecified: Secondary | ICD-10-CM | POA: Insufficient documentation

## 2020-08-17 DIAGNOSIS — R002 Palpitations: Secondary | ICD-10-CM | POA: Diagnosis not present

## 2020-08-17 DIAGNOSIS — Z20822 Contact with and (suspected) exposure to covid-19: Secondary | ICD-10-CM | POA: Diagnosis not present

## 2020-08-17 DIAGNOSIS — R001 Bradycardia, unspecified: Secondary | ICD-10-CM | POA: Insufficient documentation

## 2020-08-17 DIAGNOSIS — I10 Essential (primary) hypertension: Secondary | ICD-10-CM | POA: Diagnosis not present

## 2020-08-17 DIAGNOSIS — Z79899 Other long term (current) drug therapy: Secondary | ICD-10-CM | POA: Insufficient documentation

## 2020-08-17 DIAGNOSIS — Z882 Allergy status to sulfonamides status: Secondary | ICD-10-CM | POA: Insufficient documentation

## 2020-08-17 LAB — COMPREHENSIVE METABOLIC PANEL
ALT: 13 U/L (ref 0–44)
AST: 13 U/L — ABNORMAL LOW (ref 15–41)
Albumin: 3.7 g/dL (ref 3.5–5.0)
Alkaline Phosphatase: 53 U/L (ref 38–126)
Anion gap: 9 (ref 5–15)
BUN: 8 mg/dL (ref 6–20)
CO2: 26 mmol/L (ref 22–32)
Calcium: 9.4 mg/dL (ref 8.9–10.3)
Chloride: 104 mmol/L (ref 98–111)
Creatinine, Ser: 0.83 mg/dL (ref 0.44–1.00)
GFR, Estimated: >60 mL/min (ref >60)
Glucose, Bld: 92 mg/dL (ref 70–99)
Potassium: 4.3 mmol/L (ref 3.5–5.1)
Sodium: 139 mmol/L (ref 135–145)
Total Bilirubin: 1 mg/dL (ref 0.3–1.2)
Total Protein: 7.5 g/dL (ref 6.5–8.1)

## 2020-08-17 LAB — CBC WITH DIFFERENTIAL/PLATELET
Abs Immature Granulocytes: 0.01 10*3/uL (ref 0.00–0.07)
Basophils Absolute: 0.1 10*3/uL (ref 0.0–0.1)
Basophils Relative: 1 %
Eosinophils Absolute: 0.2 10*3/uL (ref 0.0–0.5)
Eosinophils Relative: 2 %
HCT: 46 % (ref 36.0–46.0)
Hemoglobin: 14.9 g/dL (ref 12.0–15.0)
Immature Granulocytes: 0 %
Lymphocytes Relative: 39 %
Lymphs Abs: 2.7 10*3/uL (ref 0.7–4.0)
MCH: 29.4 pg (ref 26.0–34.0)
MCHC: 32.4 g/dL (ref 30.0–36.0)
MCV: 90.9 fL (ref 80.0–100.0)
Monocytes Absolute: 0.4 10*3/uL (ref 0.1–1.0)
Monocytes Relative: 6 %
Neutro Abs: 3.6 10*3/uL (ref 1.7–7.7)
Neutrophils Relative %: 52 %
Platelets: 268 10*3/uL (ref 150–400)
RBC: 5.06 MIL/uL (ref 3.87–5.11)
RDW: 13.3 % (ref 11.5–15.5)
WBC: 6.9 10*3/uL (ref 4.0–10.5)
nRBC: 0 % (ref 0.0–0.2)

## 2020-08-17 LAB — SARS CORONAVIRUS 2 (TAT 6-24 HRS): SARS Coronavirus 2: NEGATIVE

## 2020-08-17 NOTE — Discharge Instructions (Signed)
Cut atenolol in half until you can follow up with your PCP tomorrow, log home blood pressure and heart rate readings until then. Go to the ER if your symptoms worsen in the meantime.

## 2020-08-17 NOTE — ED Triage Notes (Addendum)
Pt present with headache and Nausea starting Tuesday afternoon. Pt stated she took tylenol and cold medication for high bp which helped her sleep. bp at home was 138/90. Pt pmh of high bp. Since 2013 atenolol 50 mg and sprinalactone 25 mg daily  Pt reports bp higher in one arm at home

## 2020-08-18 ENCOUNTER — Other Ambulatory Visit: Payer: Self-pay

## 2020-08-18 ENCOUNTER — Ambulatory Visit (INDEPENDENT_AMBULATORY_CARE_PROVIDER_SITE_OTHER): Payer: BC Managed Care – PPO | Admitting: Family Medicine

## 2020-08-18 DIAGNOSIS — G44209 Tension-type headache, unspecified, not intractable: Secondary | ICD-10-CM | POA: Diagnosis not present

## 2020-08-18 DIAGNOSIS — I10 Essential (primary) hypertension: Secondary | ICD-10-CM

## 2020-08-18 DIAGNOSIS — Z566 Other physical and mental strain related to work: Secondary | ICD-10-CM

## 2020-08-18 NOTE — Assessment & Plan Note (Signed)
Reports feeling stressed at work recently and is thinking about changes jobs. PH9-6, question 9-negative. Explained to pt that we have in house psychologist for therapy if needed. Recommended f/u with Dr Atha Starks.

## 2020-08-18 NOTE — Assessment & Plan Note (Addendum)
Reassured patient that blood pressures are normal today in clinic and that it may have been slightly elevated a few days ago due to stress.  She does report feeling stressed at work.  Recommend 25 mg atenolol for the next week, follow-up with Dr. Atha Starks next week where they can reduce the dose to 12.5 mg atenolol and gradually wean off.  Explained the side effects of abruptly stopping atenolol such as palpitations.  I also recommended that atenolol is not an ideal pressure medication for her and she should ideally start on losartan/HCTZ in the future.  Encouraged her to keep a blood pressure diary at home with these medication changes. Safety precautions provided. Patient is happy with this plan.

## 2020-08-18 NOTE — Patient Instructions (Signed)
Thank you for coming to see me today. It was a pleasure. Today we discussed your BP medicines today. Continue atenolol 25mg  daily and f/u with Dr next week where he can reduce the dose to 12.5mg . the goal is come off this medicine and start another one which is better for you. Your heart rate will come up as we decrease the dose of atenolol. If you feel dizzy then please contact Atha Starks about what to do.   For your headaches, they sound like tension headaches.   If you have any questions or concerns, please do not hesitate to call the office at [phone].  If you develop fevers>100.5, shortness of breath, chest pain, palpitations, dizziness, abdominal pain, nausea, vomiting, diarrhea or cannot eat or drink then please go to the ER immediately.  Best wishes,   Dr [phone]    Tension Headache, Adult A tension headache is a feeling of pain, pressure, or aching in the head. It is often felt over the front and sides of the head. Tension headaches can last from 30 minutes to several days. What are the causes? The cause of this condition is not known. Sometimes, tension headaches are brought on by stress, worry (anxiety), or depression. Other things that may set them off include:  Alcohol.  Too much caffeine or caffeine withdrawal.  Colds, flu, or sinus infections.  Dental problems. This can include clenching your teeth.  Being tired.  Holding your head and neck in the same position for a long time, such as while using a computer.  Smoking.  Arthritis in the neck. What are the signs or symptoms?  Feeling pressure around the head.  A dull ache in the head.  Pain over the front and sides of the head.  Feeling sore or tender in the muscles of the head, neck, and shoulders. How is this treated? This condition may be treated with lifestyle changes and with medicines that help relieve symptoms. Follow these instructions at home: Managing pain  Take over-the-counter and  prescription medicines only as told by your doctor.  When you have a headache, lie down in a dark, quiet room.  If told, put ice on your head and neck. To do this: ? Put ice in a plastic bag. ? Place a towel between your skin and the bag. ? Leave the ice on for 20 minutes, 2-3 times a day. ? Take off the ice if your skin turns bright red. This is very important. If you cannot feel pain, heat, or cold, you have a greater risk of damage to the area.  If told, put heat on the back of your neck. Do this as often as told by your doctor. Use the heat source that your doctor recommends, such as a moist heat pack or a heating pad. ? Place a towel between your skin and the heat source. ? Leave the heat on for 20-30 minutes. ? Take off the heat if your skin turns bright red. This is very important. If you cannot feel pain, heat, or cold, you have a greater risk of getting burned. Eating and drinking  Eat meals on a regular schedule.  If you drink alcohol: ? Limit how much you have to:  0-1 drink a day for women who are not pregnant.  0-2 drinks a day for men. ? Know how much alcohol is in your drink. In the [place] one drink equals one 12 oz bottle of beer (355 mL), one 5 oz glass of wine (  148 mL), or one 1 oz glass of hard liquor (44 mL).  Drink enough fluid to keep your pee (urine) pale yellow.  Do not use a lot of caffeine, or stop using caffeine. Lifestyle  Get 7-9 hours of sleep each night. Or get the amount of sleep that your doctor tells you to.  At bedtime, keep computers, phones, and tablets out of your room.  Find ways to lessen your stress. This may include: ? Exercise. ? Deep breathing. ? Yoga. ? Listening to music. ? Thinking positive thoughts.  Sit up straight. Try to relax your muscles.  Do not smoke or use any products that contain nicotine or tobacco. If you need help quitting, ask your doctor. General instructions  Avoid things that can bring on headaches. Keep a  headache journal to see what may bring on headaches. For example, write down: ? What you eat and drink. ? How much sleep you get. ? Any change to your diet or medicines.  Keep all follow-up visits.   Contact a doctor if:  Your headache does not get better.  Your headache comes back.  You have a headache, and sounds, light, or smells bother you.  You feel like you may vomit, or you vomit.  Your stomach hurts. Get help right away if:  You all of a sudden get a very bad headache with any of these things: ? A stiff neck. ? Feeling like you may vomit. ? Vomiting. ? Feeling mixed up (confused). ? Feeling weak in one part or one side of your body. ? Having trouble seeing or speaking, or both. ? Feeling short of breath. ? A rash. ? Feeling very sleepy. ? Pain in your eye or ear. ? Trouble walking or balancing. ? Feeling like you will faint, or you faint. Summary  A tension headache is pain, pressure, or aching in your head.  Tension headaches can last from 30 minutes to several days.  Lifestyle changes and medicines may help relieve pain. This information is not intended to replace advice given to you by your health care provider. Make sure you discuss any questions you have with your health care provider. Document Revised: 04/06/2020 Document Reviewed: 04/06/2020 Elsevier Patient Education  2021 [employer].

## 2020-08-18 NOTE — Progress Notes (Signed)
     SUBJECTIVE:   CHIEF COMPLAINT / HPI:   Briana Rodgers is a 45 y.o. female presents with headache and BP concerns   Hypertension Pt has been worried about Bps over the last few days. Patient's current antihypertensive  medications include: 25mg  spirolactone and 50mg  atenolol which previous PCP started. She had a frontal headache a few days ago-see below. She checked her BP at home with readings between 106/80 and 140/90. She was worried about the elevated reading so she [facility] who said her BP was okay but her HR was in the 40s on EKG. They recommended reducing the dose of atenolol to 25mg  and was sent home. Denies any SOB, CP, vision changes, LE edema or symptoms of hypotension.   Most recent creatinine trend:  Lab Results  Component Value Date   CREATININE 0.83 08/17/2020   CREATININE 0.76 04/04/2008    Patient has had a BMP in the past 1 year.   Headache Onset: sudden Location: frontal  Quality: sharp, pressure  Frequency: constant  Precipitating factors: moving  Prior treatment: tylenol   Associated Symptoms Nausea/vomiting: yes  Photophobia/phonophobia: no  Tearing of eyes: yes  Sinus pain/pressure: no  Family hx migraine: no  Personal stressors: no  Relation to menstrual cycle: no   Red Flags Fever: no  Neck pain/stiffness: no  Vision/speech/swallow/hearing difficulty: no  Focal weakness/numbness: no  Altered mental status: no  Trauma: no  New type of headache: yes  Anticoagulant use: no  H/o cancer/HIV/Pregnancy: no  Palpitations headache, nausea   PERTINENT  PMH / PSH:   OBJECTIVE:   BP 130/86   Pulse (!) 52   Ht 5\' 3"  (1.6 m)   Wt 290 lb (131.5 kg)   LMP 06/21/2020   BMI 51.37 kg/m    General: Alert, no acute distress Cardio: Normal S1 and S2, RRR, no r/m/g, pulse regular 50 bpm  Pulm: CTAB, normal work of breathing Abdomen: Bowel sounds normal. Abdomen soft and non-tender.  Extremities: No peripheral edema.  Neuro: Cranial nerve  exam normal  ASSESSMENT/PLAN:   Hypertension Reassured patient that blood pressures are normal today in clinic and that it may have been slightly elevated a few days ago due to stress.  She does report feeling stressed at work.  Recommend 25 mg atenolol for the next week, follow-up with Dr. 08/19/2020 next week where they can reduce the dose to 12.5 mg atenolol and gradually wean off.  Explained the side effects of abruptly stopping atenolol such as palpitations.  I also recommended that atenolol is not an ideal pressure medication for her and she should ideally start on losartan/HCTZ in the future.  Encouraged her to keep a blood pressure diary at home with these medication changes. Safety precautions provided. Patient is happy with this plan.  Tension headache Likely tension headache. No red flags for headache and normal cranial nerve exam. Recommended tylenol, motrin, eating 3 meals a day and staying adequately hydrated stress management etc   Stress at work Reports feeling stressed at work recently and is thinking about changes jobs. PH9-6, question 9-negative. Explained to pt that we have in house psychologist for therapy if needed. Recommended f/u with Dr 04/06/2008.      , MD PGY-2 [facility]

## 2020-08-18 NOTE — Assessment & Plan Note (Addendum)
Likely tension headache. No red flags for headache and normal cranial nerve exam. Recommended tylenol, motrin, eating 3 meals a day and staying adequately hydrated stress management etc

## 2020-08-19 NOTE — ED Provider Notes (Signed)
[facility]    CSN: [id number] Arrival date & time: 08/17/20  1028      History   Chief Complaint Chief Complaint  Patient presents with  . Headache    HPI Briana Rodgers is a 45 y.o. female.   Here today with headache and nausea x 4 days. The headache feels like pressure across forehead and sinuses though no congestion, ear pain, cough, sore throat. Notes her home BP has been 130s/90 at home the past day or two which she is concerned may be causing the headache as her pressures are usually 110-120/80s. No known recent illnesses or sick contacts, compliant with atenolol and spironolactone which she's been on for years and followed by PCP.      Past Medical History:  Diagnosis Date  . Hypertension     Patient Active Problem List   Diagnosis Date Noted  . Tension headache 08/18/2020  . Stress at work 08/18/2020  . Hypertension 05/08/2020  . Cough 05/08/2020    Past Surgical History:  Procedure Laterality Date  . CESAREAN SECTION      OB History   No obstetric history on file.      Home Medications    Prior to Admission medications   Medication Sig Start Date End Date Taking? Authorizing Provider  acetaminophen (TYLENOL) 500 MG tablet Take 1,000 mg by mouth daily as needed for pain. For pain    [provider]  atenolol (TENORMIN) 25 MG tablet TAKE 2 TABLETS BY MOUTH EVERY DAY 08/16/20   Jackelyn Poling, DO  diclofenac (VOLTAREN) 75 MG EC tablet Take 1 tablet (75 mg total) by mouth 2 (two) times daily. Patient not taking: Reported on 05/08/2020 02/13/17   Dorena Bodo, NP  methocarbamol (ROBAXIN) 500 MG tablet Take 1 tablet (500 mg total) by mouth 2 (two) times daily. Patient not taking: Reported on 05/08/2020 09/13/12   Derwood Kaplan, MD  sertraline (ZOLOFT) 25 MG tablet TAKE 1 TABLET (25 MG TOTAL) BY MOUTH DAILY. 08/16/20   Jackelyn Poling, DO  spironolactone (ALDACTONE) 25 MG tablet Take 25 mg by mouth daily.    [provider]     Family History Family History  Problem Relation Age of Onset  . Kidney cancer Mother   . Hypertension Mother     Social History Social History   Tobacco Use  . Smoking status: Never Smoker  . Smokeless tobacco: Never Used  Substance Use Topics  . Alcohol use: No  . Drug use: Yes    Types: Marijuana    Comment: daily      Allergies   Sulfa antibiotics   Review of Systems Review of Systems PER HPI   Physical Exam Triage Vital Signs ED Triage Vitals  Enc Vitals Group     BP 08/17/20 1059 (!) 133/94     Pulse Rate 08/17/20 1059 (!) 49     Resp 08/17/20 1059 18     Temp 08/17/20 1056 (!) 97.4 F (36.3 C)     Temp Source 08/17/20 1056 Oral     SpO2 08/17/20 1059 100 %     Weight 08/17/20 1053 289 lb (131.1 kg)     Height 08/17/20 1053 5\' 3"  (1.6 m)     Head Circumference --      Peak Flow --      Pain Score 08/17/20 1052 7     Pain Loc --      Pain Edu? --      Excl. in GC? --  No data found.  Updated Vital Signs BP 128/77 (BP Location: Left Arm)   Pulse (!) 49   Temp (!) 97.4 F (36.3 C) (Oral)   Resp 18   Ht 5\' 3"  (1.6 m)   Wt 289 lb (131.1 kg)   LMP 06/21/2020 (Approximate)   SpO2 100%   BMI 51.19 kg/m   Visual Acuity Right Eye Distance:   Left Eye Distance:   Bilateral Distance:    Right Eye Near:   Left Eye Near:    Bilateral Near:     Physical Exam Vitals and nursing note reviewed.  Constitutional:      Appearance: Normal appearance. She is not ill-appearing.  HENT:     Head: Atraumatic.     Right Ear: Tympanic membrane normal.     Left Ear: Tympanic membrane normal.     Nose: Nose normal.     Mouth/Throat:     Mouth: Mucous membranes are moist.     Pharynx: Oropharynx is clear. No posterior oropharyngeal erythema.  Eyes:     Extraocular Movements: Extraocular movements intact.     Conjunctiva/sclera: Conjunctivae normal.  Cardiovascular:     Rate and Rhythm: Normal rate and regular rhythm.     Heart sounds: Normal  heart sounds.  Pulmonary:     Effort: Pulmonary effort is normal.     Breath sounds: Normal breath sounds.  Abdominal:     General: Bowel sounds are normal. There is no distension.     Palpations: Abdomen is soft.     Tenderness: There is no abdominal tenderness. There is no guarding.  Musculoskeletal:        General: Normal range of motion.     Cervical back: Normal range of motion and neck supple.  Skin:    General: Skin is warm and dry.  Neurological:     General: No focal deficit present.     Mental Status: She is alert and oriented to person, place, and time.     Cranial Nerves: No cranial nerve deficit.     Motor: No weakness.     Gait: Gait normal.  Psychiatric:        Mood and Affect: Mood normal.        Thought Content: Thought content normal.        Judgment: Judgment normal.      UC Treatments / Results  Labs (all labs ordered are listed, but only abnormal results are displayed) Labs Reviewed  COMPREHENSIVE METABOLIC PANEL - Abnormal; Notable for the following components:      Result Value   AST 13 (*)    All other components within normal limits  SARS CORONAVIRUS 2 (TAT 6-24 HRS)  CBC WITH DIFFERENTIAL/PLATELET    EKG   Radiology No results found.  Procedures Procedures (including critical care time)  Medications Ordered in UC Medications - No data to display  Initial Impression / Assessment and Plan / UC Course  I have reviewed the triage vital signs and the nursing notes.  Pertinent labs & imaging results that were available during my care of the patient were reviewed by me and considered in my medical decision making (see chart for details).  Clinical Course as of 08/19/20 1002  Thu Aug 17, 2020  1207 ED EKG [AW]    Clinical Course User Index [AW] Aug 19, 2020, NP    Vital signs benign today aside from bradycardia to 45-49 bpm, exam reassuring without abnormality. EKG sinus bradycardia without ST or T wave  abnormality. COVID pcr,  basic labs all pending, discussed reassurance regarding her BPs as even her high readings were technically WNL. Cut atenolol in half given bradycardia and log home BP and HR readings. Has f/u with PCP tomorrow to review sxs and further evaluate. ED if acutely worsening sxs in meantime.   Final Clinical Impressions(s) / UC Diagnoses   Final diagnoses:  Acute nonintractable headache, unspecified headache type  Palpitations  Bradycardia     Discharge Instructions     Cut atenolol in half until you can follow up with your PCP tomorrow, log home blood pressure and heart rate readings until then. Go to the ER if your symptoms worsen in the meantime.     ED Prescriptions    None     PDMP not reviewed this encounter.   Particia Nearing, [place] 08/19/20 1002

## 2020-08-25 NOTE — Progress Notes (Signed)
SUBJECTIVE:   CHIEF COMPLAINT / HPI:   Hypertension-follow-up: Patient is 45 year old female the presents today for hypertension follow-up.  She was previously seen on 08/18/2020 where it was noted that her blood pressures had improved to normal limits with a BP of 130/86 at that time. She is taking spironolactone as well. Patient had been doing well on 25 mg atenolol.  At one time she had increased this to 50 mg/day but had periods of bradycardia.  At the last appointment there was discussion of reducing the atenolol to 12.5 mg and gradually weaning off of this with a consideration for losartan/hydrochlorthiazide in the future if she needed it for blood pressure management.  Patient has been keeping a blood pressure diary since that time.  Average blood pressures have been low 120s systolically.  Patient states that she is interested in continuing to wean down her atenolol at this time.   Anxiety follow-up: Patient previously seen by myself about 1 month ago and started on Zoloft due to anxiety with an elevated GAD-7 at that time.  Today patient states she has noticed some better days.  Patient denies any SI/HI.   PERTINENT  PMH / PSH: Hypertension  OBJECTIVE:   BP 136/80   Pulse 76   Ht 5\' 4"  (1.626 m)   Wt 292 lb 3.2 oz (132.5 kg)   LMP 08/21/2020 (Exact Date)   SpO2 97%   BMI 50.16 kg/m    GAD 7 : Generalized Anxiety Score 08/28/2020 07/24/2020  Nervous, Anxious, on Edge 1 1  Control/stop worrying 1 2  Worry too much - different things 2 1  Trouble relaxing 1 1  Restless 1 3  Easily annoyed or irritable 1 3  Afraid - awful might happen 1 2  Total GAD 7 Score 8 13    General: NAD, pleasant, able to participate in exam Cardiac: RRR, no murmurs. Respiratory: CTAB, normal effort Psych: Normal affect and mood  ASSESSMENT/PLAN:   Hypertension Assessment/Plan: 45 year old female with a history of hypertension, continues on 25 mg atenolol.  Patient had discussion to reduce to  12.5 and keep spironolactone the same.  BP today of 136/80, however patient has been checking her pressures at the house daily and has seen an average systolic number of around 120-124.  We will decrease atenolol to 12.5 mg.  We will continue the spironolactone.  Patient plans to check her blood pressures at the house for the next 1 week and make a follow-up appointment in about 1 to 2 weeks from now for blood pressure check.  If her pressures continue to do well we can consider completely stopping the atenolol.  If her pressures increase we can consider switching the atenolol for another medication if appropriate at that time.   Anxiety Assessment: Anxiety improved after initiating Zoloft 1 month ago.  GAD-7 at that time of 13, improved to 8 today.  No SI/HI.  Patient wishes to continue on her Zoloft for another 1 to 2 weeks to see some active effectiveness at 25 mg/day.  I discussed with her that we can increase this over the phone since we have had an appointment regarding this if in the next 2 weeks she would like to try 50 mg/day. Plan: -Continue Zoloft at 25 mg/day -Can consider increasing to 50 mg/day if the patient wishes at the next appointment or over the phone as we have discussed this in person. -Patient plans to follow-up in 2 weeks either via MyChart message or schedule appointment  Lurline Del, [place]    This note was prepared using Dragon voice recognition software and may include unintentional dictation errors due to the inherent limitations of voice recognition software.

## 2020-08-25 NOTE — Patient Instructions (Signed)
It was great to see you! Thank you for allowing me to participate in your care!  Our plans for today:  - We are reducing your atenolol to 12.5mg  per day. I have sent in a new prescription to your pharmacy.  I would like for you to check your blood pressure at least once per day and make a follow-up appointment for about 2 weeks from now for a blood pressure check.  If your blood pressures are excellent at the house you can call or send me a MyChart message with those numbers and we can cancel that appointment. -I want you to continue your Zoloft at the current dose for the time being.  You may get little bit more benefit over the next 2 weeks.  You can call me over the next 2 weeks or make a follow-up appointment and we can increase this dosing if you are not getting a sufficient benefit at that time.  Take care and seek immediate care sooner if you develop any concerns.   Dr. Jackelyn Poling, DO [facility]

## 2020-08-28 ENCOUNTER — Other Ambulatory Visit: Payer: Self-pay

## 2020-08-28 ENCOUNTER — Encounter: Payer: Self-pay | Admitting: Family Medicine

## 2020-08-28 ENCOUNTER — Ambulatory Visit (INDEPENDENT_AMBULATORY_CARE_PROVIDER_SITE_OTHER): Payer: BC Managed Care – PPO | Admitting: Family Medicine

## 2020-08-28 DIAGNOSIS — F419 Anxiety disorder, unspecified: Secondary | ICD-10-CM | POA: Insufficient documentation

## 2020-08-28 DIAGNOSIS — I10 Essential (primary) hypertension: Secondary | ICD-10-CM | POA: Diagnosis not present

## 2020-08-28 MED ORDER — ATENOLOL 25 MG PO TABS: 12.5000 mg | ORAL_TABLET | Freq: Every day | ORAL | 1 refills | Status: DC

## 2020-08-28 NOTE — Assessment & Plan Note (Signed)
Assessment: Anxiety improved after initiating Zoloft 1 month ago.  GAD-7 at that time of 13, improved to 8 today.  No SI/HI.  Patient wishes to continue on her Zoloft for another 1 to 2 weeks to see some active effectiveness at 25 mg/day.  I discussed with her that we can increase this over the phone since we have had an appointment regarding this if in the next 2 weeks she would like to try 50 mg/day. Plan: -Continue Zoloft at 25 mg/day -Can consider increasing to 50 mg/day if the patient wishes at the next appointment or over the phone as we have discussed this in person. -Patient plans to follow-up in 2 weeks either via MyChart message or schedule appointment

## 2020-08-28 NOTE — Assessment & Plan Note (Signed)
Assessment/Plan: 45 year old female with a history of hypertension, continues on 25 mg atenolol.  Patient had discussion to reduce to 12.5 and keep spironolactone the same.  BP today of 136/80, however patient has been checking her pressures at the house daily and has seen an average systolic number of around 120-124.  We will decrease atenolol to 12.5 mg.  We will continue the spironolactone.  Patient plans to check her blood pressures at the house for the next 1 week and make a follow-up appointment in about 1 to 2 weeks from now for blood pressure check.  If her pressures continue to do well we can consider completely stopping the atenolol.  If her pressures increase we can consider switching the atenolol for another medication if appropriate at that time.

## 2020-09-19 ENCOUNTER — Encounter: Payer: Self-pay | Admitting: Family Medicine

## 2020-09-20 ENCOUNTER — Other Ambulatory Visit: Payer: Self-pay | Admitting: Family Medicine

## 2020-09-20 MED ORDER — SERTRALINE HCL 50 MG PO TABS
50.0000 mg | ORAL_TABLET | Freq: Every day | ORAL | 1 refills | Status: DC
Start: 2020-09-20 — End: 2021-04-16

## 2020-09-20 NOTE — Progress Notes (Signed)
Called patient in response to her message about increasing her Zoloft.  As discussed at her previous appointment we would be able to increase this medication if she so desired and she would not have to have another appointment in order to do this.  Patient denies any suicidal or homicidal ideation at this time.  She does believe that she got a small benefit from the 25 mg/day, however she would like to increase it to see if she gets further benefit.  Will increase to 50 mg/day.  I instructed the patient to take 2 of her 25 mg tablets each day until she runs out and also informed her that I have sent in a new prescription at 50 mg/tab. I discussed with her that we can monitor this for the next 1 month and then reassess if we need to try to further increase the dose.  Patient understands that she can reach out to me at any time if she develops any concerns.

## 2020-09-22 ENCOUNTER — Ambulatory Visit: Payer: BC Managed Care – PPO | Admitting: Family Medicine

## 2020-10-08 NOTE — Progress Notes (Signed)
    SUBJECTIVE:   CHIEF COMPLAINT / HPI:   Anxiety-follow-up: Patient is a 45 year old female presenting today for follow-up on her anxiety.  At previous appointment we increased her Zoloft to 50 mg/day.  She presents today to see how her symptoms are doing since doing this.  She states she is doing well after increasing to 50 mg/day and has noticed some improvement.  She denies any suicidal ideation or homicidal ideation at this time.  She is interested in continuing the 50 mg a bit longer and then possibly increasing it if she does not feel she has hit a maximal benefit with it..  Blood pressure follow-up Has been seeing numbers in the upper 120s to low 130s systolic at home.  The previous plan was to reduce her atenolol with the plan to discontinue it and start another blood pressure medication if indicated.  The patient had initially started the atenolol for palpitations but found that after reducing these she has had fewer palpitations.  She is mildly bradycardic in the office today.  She does have an IUD in place for contraception.   PERTINENT  PMH / PSH: History of palpitations  OBJECTIVE:   BP 132/80   Pulse (!) 51   Ht 5\' 4"  (1.626 m)   Wt 288 lb 9.6 oz (130.9 kg)   LMP 09/13/2020 (Approximate)   SpO2 99%   BMI 49.54 kg/m    Heart rate recheck 56  GAD 7 : Generalized Anxiety Score 10/09/2020 08/28/2020 07/24/2020  Nervous, Anxious, on Edge 1 1 1   Control/stop worrying 1 1 2   Worry too much - different things 1 2 1   Trouble relaxing 1 1 1   Restless 0 1 3  Easily annoyed or irritable 1 1 3   Afraid - awful might happen 0 1 2  Total GAD 7 Score 5 8 13   Anxiety Difficulty Somewhat difficult - -    General: NAD, pleasant, able to participate in exam Cardiac: RRR, no murmurs. Respiratory: CTAB, normal effort Psych: Normal affect and mood  ASSESSMENT/PLAN:   Hypertension Blood pressure today 132/80.  She is checked her pressures at the house with numbers in the upper  120s/low 130s systolically.  We are going to discontinue the atenolol today as planned and initiate losartan.  She is going to come back in 1 week for follow-up blood work to monitor to her potassium and creatinine function.  She does continue on the spironolactone which can also increase potassium levels.  Anxiety Assessment: 45 year old female with history of anxiety who recently started on Zoloft.  We previously titrated this up to 50 mg/day which she is tolerating well.  GAD-7 score today of 8.  Patient denies SI/HI at this time. Plan: -We will continue 50 mg/day at this time -Can follow-up in about 4 weeks to determine if we should increase it further.    , DO [facility]    This note was prepared using Dragon voice recognition software and may include unintentional dictation errors due to the inherent limitations of voice recognition software.

## 2020-10-08 NOTE — Patient Instructions (Signed)
It was great to see you! Thank you for allowing me to participate in your care!  I recommend that you always bring your medications to each appointment as this makes it easy to ensure we are on the correct medications and helps [place] not miss when refills are needed.  Our plans for today:  -We are going to stop your atenolol.  If you notice that you have more palpitations after stopping this please let me know. -We are starting a new medication today called losartan.  It is important to continue birth control on this medication as it can cause bad effects for a growing fetus.  I would like for you to check your blood pressures at least once per day if possible over the next 1 week. -It is important that you come back in 1 week for blood work to make sure your potassium and kidney function are doing well.  You can either see a provider and bring your blood pressure measurements with you or can message me your blood pressure measurements and come in for the future lab order.  I have already ordered this lab and you can simply call and make a lab appointment at your convenience. -I would like for you to continue the Zoloft at 50 mg/day right now, we can discuss increasing this in the future if you need.   Take care and seek immediate care sooner if you develop any concerns.   Dr. Jackelyn Poling, DO [facility]

## 2020-10-09 ENCOUNTER — Other Ambulatory Visit: Payer: Self-pay

## 2020-10-09 ENCOUNTER — Ambulatory Visit (INDEPENDENT_AMBULATORY_CARE_PROVIDER_SITE_OTHER): Payer: BC Managed Care – PPO | Admitting: Family Medicine

## 2020-10-09 ENCOUNTER — Encounter: Payer: Self-pay | Admitting: Family Medicine

## 2020-10-09 VITALS — BP 132/80 | HR 51 | Ht 64.0 in | Wt 288.6 lb

## 2020-10-09 DIAGNOSIS — F419 Anxiety disorder, unspecified: Secondary | ICD-10-CM | POA: Diagnosis not present

## 2020-10-09 DIAGNOSIS — I10 Essential (primary) hypertension: Secondary | ICD-10-CM

## 2020-10-09 MED ORDER — LOSARTAN POTASSIUM 25 MG PO TABS: 25.0000 mg | ORAL_TABLET | Freq: Every day | ORAL | 3 refills | Status: DC

## 2020-10-09 NOTE — Assessment & Plan Note (Signed)
Assessment: 45 year old female with history of anxiety who recently started on Zoloft.  We previously titrated this up to 50 mg/day which she is tolerating well.  GAD-7 score today of 8.  Patient denies SI/HI at this time. Plan: -We will continue 50 mg/day at this time -Can follow-up in about 4 weeks to determine if we should increase it further.

## 2020-10-09 NOTE — Assessment & Plan Note (Signed)
Blood pressure today 132/80.  She is checked her pressures at the house with numbers in the upper 120s/low 130s systolically.  We are going to discontinue the atenolol today as planned and initiate losartan.  She is going to come back in 1 week for follow-up blood work to monitor to her potassium and creatinine function.  She does continue on the spironolactone which can also increase potassium levels.

## 2020-10-25 ENCOUNTER — Telehealth: Payer: Self-pay | Admitting: Family Medicine

## 2020-10-25 ENCOUNTER — Encounter: Payer: Self-pay | Admitting: Family Medicine

## 2020-10-25 NOTE — Telephone Encounter (Signed)
FMLA form dropped off for at front desk for completion.  Verified that patient section of form has been completed.  Last DOS/WCC with PCP was 10/09/20  Placed form in team folder to be completed by clinical staff.  Grayce Fredda Hammed

## 2020-10-26 NOTE — Telephone Encounter (Signed)
Clinical info completed on fmla form.  Place form in Dr. Vergie Living box for completion.  Zubin Pontillo, CMA

## 2020-10-30 NOTE — Telephone Encounter (Signed)
FMLA completed and placed in nurse box upfront.

## 2020-10-31 NOTE — Telephone Encounter (Signed)
Called patient and informed that paperwork was ready for pick up. Faxed to [employer] per patient instructions. Copy placed up front for patient to pick up. Copy made and placed in batch scanning.   Veronda Prude, RN

## 2020-11-21 ENCOUNTER — Encounter: Payer: Self-pay | Admitting: Family Medicine

## 2021-02-14 ENCOUNTER — Encounter: Payer: Self-pay | Admitting: Family Medicine

## 2021-02-15 ENCOUNTER — Other Ambulatory Visit: Payer: Self-pay | Admitting: Family Medicine

## 2021-02-15 MED ORDER — FLUCONAZOLE 150 MG PO TABS: 150.0000 mg | ORAL_TABLET | Freq: Once | ORAL | 0 refills | Status: AC

## 2021-04-14 ENCOUNTER — Other Ambulatory Visit: Payer: Self-pay | Admitting: Family Medicine

## 2021-04-30 ENCOUNTER — Encounter: Payer: Self-pay | Admitting: Family Medicine

## 2021-04-30 ENCOUNTER — Telehealth: Payer: Self-pay | Admitting: Family Medicine

## 2021-04-30 NOTE — Telephone Encounter (Signed)
Patient dropped off forms at front desk for completion.  Verified that patient section of form has been completed.  Last DOS with PCP was 10/09/2020.  Placed form in [facility] team folder to be completed by clinical staff.  [name]

## 2021-05-03 NOTE — Telephone Encounter (Signed)
Form faxed and a copy was made for batch scanning.  ?

## 2021-05-15 ENCOUNTER — Other Ambulatory Visit: Payer: Self-pay | Admitting: Family Medicine

## 2021-06-03 NOTE — Progress Notes (Signed)
    SUBJECTIVE:   CHIEF COMPLAINT / HPI:   Hypertension: Patient is a 44 y.o. female who present today for follow up of hypertension.   Patient endorses no problems  Home medications include: Losartan Patient endorses taking these medications as prescribed.  Most recent creatinine trend:  Lab Results  Component Value Date   CREATININE 0.83 08/17/2020   CREATININE 0.76 04/04/2008   Patient does check blood pressure at home.  Has been checking blood pressures at home and seeing pressures in the 130s systolic.  Patient has had a BMP in the past 1 year.   Prediabetes/Discuss weight loss options: Previous A1c of 6.1 in 2021. No current medications.  She is interested in a referral for healthy weight .  She states she has lost about 8 pounds over the past 6 or 8 months which I can see in her chart.  She recently got a pet dog and has been using this to change her habit to boost her exercise by going for walks, runs with the dog.  She has found this effective but is looking for additional modalities for weight loss.  Encounter for PAP: Patient due for Pap smear.  Also request gonorrhea and chlamydia swab as well as RPR and HIV screening.  Patient denies any vaginal discharge.   PERTINENT  PMH / PSH: Prediabetes  OBJECTIVE:   BP 137/68   Pulse 80   Ht 5\' 4"  (1.626 m)   Wt 282 lb 9.6 oz (128.2 kg)   LMP 04/25/2021   SpO2 98%   BMI 48.51 kg/m    BP recheck 132/72.  General: NAD, pleasant, able to participate in exam Respiratory: No respiratory distress Skin: warm and dry, no rashes noted Pelvic exam: VULVA: normal appearing vulva with no masses, tenderness or lesions, VAGINA: normal appearing vagina with normal color and discharge, no lesions, CERVIX: normal appearing cervix without discharge or lesions.  Chaperone: Sophia Psych: Normal affect and mood  ASSESSMENT/PLAN:   Hypertension BP today of 137/68. Continues on losartan which we will increase to 50 mg daily.  We  will check a BMP in 1 week after she returns for blood pressure check.   Prediabetes A1c today of 5.7.   Encounter for Pap smear Due for pap smear which was completed today along with screening for gonorrhea, chlamydia, HIV/RPR.  Discuss weight loss: Patient with BMI of 48.5. Requests referral for Healthy Weight. Will place referral. Will discuss some weight loss options at follow up appointment in 1 week.  May, DO [facility]

## 2021-06-04 ENCOUNTER — Other Ambulatory Visit (HOSPITAL_COMMUNITY)
Admission: RE | Admit: 2021-06-04 | Discharge: 2021-06-04 | Disposition: A | Discharge status: Home / Self Care | Payer: BC Managed Care – PPO | Source: Ambulatory Visit | Attending: Family Medicine | Admitting: Family Medicine

## 2021-06-04 ENCOUNTER — Other Ambulatory Visit: Payer: Self-pay

## 2021-06-04 ENCOUNTER — Ambulatory Visit (INDEPENDENT_AMBULATORY_CARE_PROVIDER_SITE_OTHER): Payer: BC Managed Care – PPO | Admitting: Family Medicine

## 2021-06-04 ENCOUNTER — Encounter: Payer: Self-pay | Admitting: Family Medicine

## 2021-06-04 VITALS — BP 137/68 | HR 80 | Ht 64.0 in | Wt 282.6 lb

## 2021-06-04 DIAGNOSIS — Z124 Encounter for screening for malignant neoplasm of cervix: Secondary | ICD-10-CM | POA: Insufficient documentation

## 2021-06-04 DIAGNOSIS — R7303 Prediabetes: Secondary | ICD-10-CM | POA: Insufficient documentation

## 2021-06-04 DIAGNOSIS — I10 Essential (primary) hypertension: Secondary | ICD-10-CM | POA: Diagnosis not present

## 2021-06-04 DIAGNOSIS — Z114 Encounter for screening for human immunodeficiency virus [HIV]: Secondary | ICD-10-CM

## 2021-06-04 DIAGNOSIS — Z113 Encounter for screening for infections with a predominantly sexual mode of transmission: Secondary | ICD-10-CM

## 2021-06-04 DIAGNOSIS — Z87898 Personal history of other specified conditions: Secondary | ICD-10-CM | POA: Insufficient documentation

## 2021-06-04 LAB — POCT GLYCOSYLATED HEMOGLOBIN (HGB A1C): Hemoglobin A1C: 5.7 % — AB (ref 4.0–5.6)

## 2021-06-04 MED ORDER — LOSARTAN POTASSIUM 25 MG PO TABS: 50.0000 mg | ORAL_TABLET | Freq: Every day | ORAL | 3 refills | Status: DC

## 2021-06-04 NOTE — Assessment & Plan Note (Signed)
BP today of 137/68. Continues on losartan which we will increase to 50 mg daily.  We will check a BMP in 1 week after she returns for blood pressure check.

## 2021-06-04 NOTE — Patient Instructions (Addendum)
Follow up in 1 week for blood pressure check and BMP.  Your A1c looks great today at 5.7.  This is still in the prediabetes range but has improved.  We performed a Pap smear today and did screen for STDs and I will let you know the results when they return.  When we follow-up in 1 week we can discuss more weight loss options.  I have sent in a referral for healthy weight they should contact you in the next week.

## 2021-06-04 NOTE — Assessment & Plan Note (Signed)
A1c today of 5.7.

## 2021-06-05 LAB — RPR: RPR Ser Ql: NONREACTIVE

## 2021-06-05 LAB — HIV ANTIBODY (ROUTINE TESTING W REFLEX): HIV Screen 4th Generation wRfx: NONREACTIVE

## 2021-06-06 LAB — CYTOLOGY - PAP
Adequacy: ABSENT
Chlamydia: NEGATIVE
Comment: NEGATIVE
Comment: NEGATIVE
Comment: NORMAL
Diagnosis: NEGATIVE
High risk HPV: NEGATIVE
Neisseria Gonorrhea: NEGATIVE

## 2021-06-18 NOTE — Progress Notes (Signed)
    SUBJECTIVE:   CHIEF COMPLAINT / HPI:   Hypertension follow-up: At last appointment her losartan was increased to 50 mg daily.  Plan for her to come back today and check her blood pressure as well as a BMP.  Today she states she has been taking doses as scheduled. She has not had a chance to check BP at home.  Discussed weight loss: She was referred for healthy weight at last appointment per her request.  Following up today to get started with some general nutrition counseling.  She is eating 3 sausage links for breakfast, and a poppyseed muffin. She did not have lunch today. She sometimes has a sandwich with 2 thin sliced [place], cheese and mustard. She had fried shrimp and baked potato with cheese and bacon. She had about 7 shrimp.  PERTINENT  PMH / PSH: Hypertension  OBJECTIVE:   BP 126/85   Pulse 62   Ht 5\' 4"  (1.626 m)   Wt 284 lb 4 oz (128.9 kg)   LMP 06/05/2021   SpO2 99%   BMI 48.79 kg/m    General: NAD, pleasant, able to participate in exam Respiratory: No respiratory distress Skin: warm and dry, no rashes noted Psych: Normal affect and mood   ASSESSMENT/PLAN:   Hypertension Blood pressure today of 126/85.  Continues on losartan 50 mg daily.  We will check a BMP today.   Weight loss: Discussed aspects of nutrition including caloric balance, macronutrients, focusing on nutritious food choices, and techniques for reducing overconsumption of foods such as meal prep, avoiding "mindless eating" while watching television, etc. Discussed modalities for improved weight loss including options for meal prep, focusing on foods with higher satiated factors such as proteins and some healthy fats while being cognizant of the fact that these are higher in total calories.  Focusing on a goal of 20 grams of protein per meal for next visit.  Follow-up in 2 to 3 weeks   06/07/2021, DO [facility]

## 2021-06-20 ENCOUNTER — Encounter: Payer: Self-pay | Admitting: Family Medicine

## 2021-06-20 ENCOUNTER — Ambulatory Visit (INDEPENDENT_AMBULATORY_CARE_PROVIDER_SITE_OTHER): Payer: BC Managed Care – PPO | Admitting: Family Medicine

## 2021-06-20 ENCOUNTER — Other Ambulatory Visit: Payer: Self-pay

## 2021-06-20 VITALS — BP 126/85 | HR 62 | Ht 64.0 in | Wt 284.2 lb

## 2021-06-20 DIAGNOSIS — I10 Essential (primary) hypertension: Secondary | ICD-10-CM

## 2021-06-20 NOTE — Assessment & Plan Note (Signed)
Blood pressure today of 126/85.  Continues on losartan 50 mg daily.  We will check a BMP today.

## 2021-06-20 NOTE — Patient Instructions (Signed)
Our goal for today is I want you to aim for 20 g of protein in each of 3 meals each day.  I would like for you to get in the habit of looking at food labels to see what combinations you can do to come up with about 20 g of protein for each meal.  I would like to see back in 2 or 3 weeks to discuss how you are doing and the next steps after that.  Your blood pressure looks great today.  Continue on the losartan at 50 mg daily.  We are going to check some labs today and I will send a MyChart message or give you a call when they return.

## 2021-06-21 LAB — BASIC METABOLIC PANEL
BUN/Creatinine Ratio: 11 (ref 9–23)
BUN: 8 mg/dL (ref 6–24)
CO2: 24 mmol/L (ref 20–29)
Calcium: 9.5 mg/dL (ref 8.7–10.2)
Chloride: 105 mmol/L (ref 96–106)
Creatinine, Ser: 0.71 mg/dL (ref 0.57–1.00)
Glucose: 84 mg/dL (ref 70–99)
Potassium: 4.9 mmol/L (ref 3.5–5.2)
Sodium: 141 mmol/L (ref 134–144)
eGFR: 108 mL/min/{1.73_m2} (ref >59)

## 2021-07-05 NOTE — Progress Notes (Deleted)
° ° °  SUBJECTIVE:   CHIEF COMPLAINT / HPI:   Discuss weight loss: Patient following up after seeing myself at last visit discussing initial options to start weight loss.  At that visit we focused on a goal of 20 g of protein per meal and spoke in detail about macronutrients including protein, carbohydrates, fats.  Previously placed a referral for healthy weight specialist and she is on the wait list for them. Today she states***.  PERTINENT  PMH / PSH: ***  OBJECTIVE:   There were no vitals taken for this visit. *** General: NAD, pleasant, able to participate in exam Respiratory: No respiratory distress Skin: warm and dry, no rashes noted Psych: Normal affect and mood   ASSESSMENT/PLAN:   No problem-specific Assessment & Plan notes found for this encounter.     Briana Poling, DO [facility]    {    This will disappear when note is signed, click to select method of visit    :1}

## 2021-07-09 ENCOUNTER — Ambulatory Visit: Payer: BC Managed Care – PPO | Admitting: Family Medicine

## 2021-09-17 DIAGNOSIS — M205X2 Other deformities of toe(s) (acquired), left foot: Secondary | ICD-10-CM | POA: Diagnosis not present

## 2021-09-17 DIAGNOSIS — M79675 Pain in left toe(s): Secondary | ICD-10-CM | POA: Diagnosis not present

## 2021-10-08 ENCOUNTER — Other Ambulatory Visit: Payer: Self-pay

## 2021-10-08 ENCOUNTER — Encounter: Payer: Self-pay | Admitting: Family Medicine

## 2021-10-08 ENCOUNTER — Ambulatory Visit (INDEPENDENT_AMBULATORY_CARE_PROVIDER_SITE_OTHER): Payer: BC Managed Care – PPO | Admitting: Family Medicine

## 2021-10-08 VITALS — BP 118/82 | HR 59 | Ht 64.0 in | Wt 278.2 lb

## 2021-10-08 DIAGNOSIS — E785 Hyperlipidemia, unspecified: Secondary | ICD-10-CM | POA: Diagnosis not present

## 2021-10-08 DIAGNOSIS — R7303 Prediabetes: Secondary | ICD-10-CM

## 2021-10-08 MED ORDER — SEMAGLUTIDE(0.25 OR 0.5MG/DOS) 2 MG/1.5ML ~~LOC~~ SOPN: 0.2500 mg | PEN_INJECTOR | SUBCUTANEOUS | 1 refills | Status: DC

## 2021-10-08 NOTE — Patient Instructions (Signed)
We start a medication for your prediabetes called Ozempic today.  I want you to inject this once weekly and we will follow-up in 4 weeks to talk about increasing the dose. ? ?We will check your LDL today and I let you know the results. ? ?When you decide that you are interested in the referral for the "tummy tuck" let me know and I can place it.  As we discussed there are some risk with any surgery.  If you have further questions please let me know. ?

## 2021-10-08 NOTE — Progress Notes (Signed)
? ? ?  SUBJECTIVE:  ? ?CHIEF COMPLAINT / HPI:  ? ?Discuss abdominoplastyliposuction: ?She states she wants to have a "tummy tuck" for cosmetic purposes. She is hopeful she can use this later this summer.  She is continue to work on weight loss.. Former smoker, quit 15 years ago, smoked 1/2ppd for 6-7 years.   ? ?Prediabetes: ?Last A1c of 5.7.  She is interested in starting medication to help reduce progression to diabetes.  She is also helpful for some weight loss benefits with this medication. ? ?PERTINENT  PMH / PSH: Hyperlipidemia ? ?OBJECTIVE:  ? ?BP 118/82   Pulse (!) 59   Ht 5\' 4"  (1.626 m)   Wt 278 lb 3.2 oz (126.2 kg)   LMP 08/29/2021 (Approximate)   SpO2 98%   BMI 47.75 kg/m?   ? ?General: NAD, pleasant, able to participate in exam ?Respiratory: No respiratory distress ?Skin: warm and dry, no rashes noted ?Psych: Normal affect and mood ? ?ASSESSMENT/PLAN:  ? ?Prediabetes: ?Last A1c of 5.7.  Patient is interested in medicine to help prevent the progression to diabetes.  Discussed Ozempic.  She is interested in trying this.  We will prescribe 0.25 mg weekly.  She is going to follow-up in 4 weeks for an A1c as well as to discuss increasing the dosage. ? ?Hyperlipidemia: ?Last LDL of 101.  We will check LDL today.  Not on any medications for this. ? ?Discuss "tummy tuck": ?Patient is interested in a referral to plastic surgery in the near future.  I discussed this with her and she is interested in trying to lose some weight between now and then.  She is in the right may be having this done in the summer and does not want to have the referral quite yet.  She is going to follow-up with me in a few weeks as it gets closer to decide when she would like the referral.  We did discuss that this is a cosmetic procedure and does have some risk associated with it. ? ?10/27/2021, DO ?[facility]  ? ? ? ?

## 2021-10-09 ENCOUNTER — Encounter: Payer: Self-pay | Admitting: Family Medicine

## 2021-10-09 LAB — LIPID PANEL
Chol/HDL Ratio: 2.9 ratio (ref 0.0–4.4)
Cholesterol, Total: 174 mg/dL (ref 100–199)
HDL: 59 mg/dL (ref >39)
LDL Chol Calc (NIH): 97 mg/dL (ref 0–99)
Triglycerides: 98 mg/dL (ref 0–149)
VLDL Cholesterol Cal: 18 mg/dL (ref 5–40)

## 2021-10-17 ENCOUNTER — Other Ambulatory Visit: Payer: Self-pay | Admitting: Family Medicine

## 2021-10-29 ENCOUNTER — Encounter: Payer: Self-pay | Admitting: Family Medicine

## 2021-10-29 ENCOUNTER — Ambulatory Visit (INDEPENDENT_AMBULATORY_CARE_PROVIDER_SITE_OTHER): Payer: BC Managed Care – PPO | Admitting: Family Medicine

## 2021-10-29 VITALS — BP 117/70 | HR 60 | Ht 64.0 in | Wt 273.4 lb

## 2021-10-29 DIAGNOSIS — R7303 Prediabetes: Secondary | ICD-10-CM

## 2021-10-29 NOTE — Patient Instructions (Signed)
Today we discussed increasing the dose of semaglutide.  Take your next dose as scheduled and then the dose after that you can scale up to 0.5 mg weekly.  I will see back in about 4-5 weeks before you are ready to increase to the next dose and we will discuss increasing again. ? ?Continue the good work on diet and exercise.  You are down 5 pounds today compared to the previous visit.  If you develop any side effects, nausea, vomiting or other concerns please let me know. ?

## 2021-10-29 NOTE — Progress Notes (Signed)
? ? ?  SUBJECTIVE:  ? ?CHIEF COMPLAINT / HPI:  ? ?Prediabetes  obesity: ?46 year old female presenting today after being seen by myself on 10/08/2021 and started on Ozempic.  This was started in order to help prevent the progression of prediabetes but also provide some weight loss.  Since then she states she did have one day where she felt nauseated and vomiting a few times on Wednesday, but then felt better on Thursday, she redosed her ozempic this past week and has had no further issues. She continues on 0.25mg  weekly and is planning to increase to 0.5mg . 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  ? ?PERTINENT  PMH / PSH: prediabetes ? ?OBJECTIVE:  ? ?BP 117/70   Pulse 60   Ht 5\' 4"  (1.626 m)   Wt 273 lb 6 oz (124 kg)   LMP 10/22/2021   SpO2 100%   BMI 46.92 kg/m?   ? ?General: NAD, pleasant, able to participate in exam ?Respiratory: No respiratory distress ?Skin: warm and dry, no rashes noted ?Psych: Normal affect and mood ? ?ASSESSMENT/PLAN:  ? ?Prediabetes  obesity: ?46 year old female presenting today to discuss increasing her Ozempic.  She did have 1 bout 2 weeks ago where she vomited a few times in 1 day and felt nauseated but continues to take the medicine and has had no other bouts.  This suggest this was most likely a viral gastroenteritis.  She is down 5 pounds.  Continue to work on diet and exercise.  She is going to start tracking her steps with her apple iPhone.  She is going to increase to 0.5 mg weekly after her next/final 0.25 mg weekly dose.  She will follow-up in 4 weeks to discuss titrating again if appropriate based off of any side effects. ? ? ?Lurline Del, DO ?[place]  ? ? ? ?

## 2021-11-05 DIAGNOSIS — M205X2 Other deformities of toe(s) (acquired), left foot: Secondary | ICD-10-CM | POA: Diagnosis not present

## 2021-11-05 DIAGNOSIS — M79675 Pain in left toe(s): Secondary | ICD-10-CM | POA: Diagnosis not present

## 2021-11-12 DIAGNOSIS — Z114 Encounter for screening for human immunodeficiency virus [HIV]: Secondary | ICD-10-CM | POA: Diagnosis not present

## 2021-11-12 DIAGNOSIS — N760 Acute vaginitis: Secondary | ICD-10-CM | POA: Diagnosis not present

## 2021-11-12 DIAGNOSIS — Z113 Encounter for screening for infections with a predominantly sexual mode of transmission: Secondary | ICD-10-CM | POA: Diagnosis not present

## 2021-11-21 ENCOUNTER — Encounter: Payer: Self-pay | Admitting: Family Medicine

## 2021-11-22 ENCOUNTER — Other Ambulatory Visit: Payer: Self-pay | Admitting: Family Medicine

## 2021-11-22 DIAGNOSIS — R7303 Prediabetes: Secondary | ICD-10-CM

## 2021-11-22 MED ORDER — SEMAGLUTIDE(0.25 OR 0.5MG/DOS) 2 MG/1.5ML ~~LOC~~ SOPN: 0.5000 mg | PEN_INJECTOR | SUBCUTANEOUS | 1 refills | Status: DC

## 2021-11-22 NOTE — Progress Notes (Deleted)
    SUBJECTIVE:   CHIEF COMPLAINT / HPI:   Prediabetes  obesity: 46 year old female present today for the above.  We have been titrating her Ozempic in order to both assist with weight loss as well as glucose control.  She is currently on 0.5 mg weekly with plan to increase to 1 mg weekly if she is doing well.  Today she states***.  PERTINENT  PMH / PSH: ***  OBJECTIVE:   There were no vitals taken for this visit. ***  General: NAD, pleasant, able to participate in exam Respiratory: No respiratory distress Skin: warm and dry, no rashes noted Psych: Normal affect and mood   ASSESSMENT/PLAN:   No problem-specific Assessment & Plan notes found for this encounter.   46 year old female presenting for follow-up on prediabetes and obesity.  We have been titrating her Ozempic for weight loss and glucose control.  She is not experiencing adverse effects we are increasing to 1 mg weekly.  I have sent this prescription.  We had an extensive discussion on weight loss modalities including calorie restriction, burning more calories than are consumed, and increasing exercise volume including both aerobic and resistance training***.  Follow-up in 2 to 3 months.  Lurline Del, [place]    {    This will disappear when note is signed, click to select method of visit    :1}

## 2021-11-26 ENCOUNTER — Ambulatory Visit: Payer: BC Managed Care – PPO | Admitting: Family Medicine

## 2021-12-19 ENCOUNTER — Encounter: Payer: Self-pay | Admitting: Family Medicine

## 2021-12-20 ENCOUNTER — Other Ambulatory Visit: Payer: Self-pay | Admitting: Family Medicine

## 2021-12-20 MED ORDER — SEMAGLUTIDE (1 MG/DOSE) 4 MG/3ML ~~LOC~~ SOPN: 1.0000 mg | PEN_INJECTOR | SUBCUTANEOUS | 1 refills | Status: DC

## 2021-12-25 ENCOUNTER — Encounter: Payer: Self-pay | Admitting: *Deleted

## 2022-01-18 ENCOUNTER — Other Ambulatory Visit: Payer: Self-pay | Admitting: Family Medicine

## 2022-03-05 ENCOUNTER — Other Ambulatory Visit: Payer: Self-pay | Admitting: Family Medicine

## 2022-03-21 ENCOUNTER — Ambulatory Visit: Payer: BC Managed Care – PPO | Admitting: Student

## 2022-03-29 ENCOUNTER — Ambulatory Visit: Payer: BC Managed Care – PPO | Admitting: Student

## 2022-04-04 ENCOUNTER — Ambulatory Visit: Payer: BC Managed Care – PPO | Admitting: Student

## 2022-04-11 ENCOUNTER — Encounter: Payer: Self-pay | Admitting: Student

## 2022-04-11 ENCOUNTER — Ambulatory Visit (INDEPENDENT_AMBULATORY_CARE_PROVIDER_SITE_OTHER): Payer: BC Managed Care – PPO | Admitting: Student

## 2022-04-11 VITALS — BP 133/70 | HR 68 | Wt 251.8 lb

## 2022-04-11 DIAGNOSIS — Z6841 Body Mass Index (BMI) 40.0 and over, adult: Secondary | ICD-10-CM

## 2022-04-11 DIAGNOSIS — Z23 Encounter for immunization: Secondary | ICD-10-CM | POA: Diagnosis not present

## 2022-04-11 DIAGNOSIS — Z87898 Personal history of other specified conditions: Secondary | ICD-10-CM

## 2022-04-11 DIAGNOSIS — R7303 Prediabetes: Secondary | ICD-10-CM

## 2022-04-11 DIAGNOSIS — R011 Cardiac murmur, unspecified: Secondary | ICD-10-CM

## 2022-04-11 LAB — POCT GLYCOSYLATED HEMOGLOBIN (HGB A1C): Hemoglobin A1C: 5.3 % (ref 4.0–5.6)

## 2022-04-11 MED ORDER — OZEMPIC (2 MG/DOSE) 8 MG/3ML ~~LOC~~ SOPN: 2.0000 mg | PEN_INJECTOR | SUBCUTANEOUS | 2 refills | Status: DC

## 2022-04-11 NOTE — Patient Instructions (Addendum)
Ms. Steuart,  It is such a joy to meet you today!  I am so glad to see that the [place] is doing so well for you.  I am also very glad to hear that you have picked up running.  I think that you are just on the cusp of being able to complete a 5K.  Some people find going ahead and registering for a race to be a helpful motivator if you find that you need extra motivation to get out there and run. I have increased your dose to 2 mg/week.  You may notice that you have some nausea for about a week with this new dose.  If it persists greater than 1 week and you feel like it is not going to work for you, please shoot me a message and we can go back to the 1 mg dose. I have ordered an echocardiogram of your heart.  You do have a murmur that does not look like its been well documented, I'd like [place] to get a good look at your heart to try and determine where this is coming from. I'm not worried about you in the short term, but if you do have any abnormalities, it would be good for [place] to be aware of them now.  Your A1c is in normal range today!! 5.3%. You reversed your prediabetes--this is a BIG deal! Congrats,  Pearla Dubonnet, MD

## 2022-04-12 DIAGNOSIS — R011 Cardiac murmur, unspecified: Secondary | ICD-10-CM | POA: Insufficient documentation

## 2022-04-12 DIAGNOSIS — Z6841 Body Mass Index (BMI) 40.0 and over, adult: Secondary | ICD-10-CM | POA: Insufficient documentation

## 2022-04-12 NOTE — Assessment & Plan Note (Signed)
Weight is down 22 lbs. Congratulated on success. Exercise routine is certainly helping her health in addition to the [place] to 2mg /week - Continue running routine, hope she can train for a 5k soon!

## 2022-04-12 NOTE — Assessment & Plan Note (Signed)
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 Will obtain baseline echo.

## 2022-04-12 NOTE — Assessment & Plan Note (Signed)
Reversed. A1c wnl today. Congratulated on success

## 2022-04-12 NOTE — Progress Notes (Signed)
    SUBJECTIVE:   CHIEF COMPLAINT / HPI:   Weight Management Prediabetes Patient here to follow-up. Has been on Ozempic for several months at the 1mg /week dose. Weight is down 22lbs. She has also picked up routine exercise, is now running regularly and is up to running 2 mi at a time. Feeling well without issues with her medication. Feels that she would benefit from a further dose increase on her Ozempic.  Heart Murmur Noted systolic murmur on my exam. Patient reports that she recalls someone telling her she had a murmur when she was born but it does not seem that this has ever been followed up on.   OBJECTIVE:   BP 133/70   Pulse 68   Wt 251 lb 12.8 oz (114.2 kg)   LMP 04/06/2022   SpO2 98%   BMI 43.22 kg/m   Physical Exam Vitals reviewed.  Constitutional:      General: She is not in acute distress. Cardiovascular:     Rate and Rhythm: Normal rate and regular rhythm.     Comments: 2/6 Systolic murmur heard best at RUSB, radiates to the carotids Pulmonary:     Effort: Pulmonary effort is normal. No respiratory distress.     Breath sounds: No wheezing or rhonchi.  Skin:    General: Skin is warm and dry.  Psychiatric:        Mood and Affect: Mood normal.      ASSESSMENT/PLAN:   BMI 40.0-44.9, adult (HCC) Weight is down 22 lbs. Congratulated on success. Exercise routine is certainly helping her health in addition to the [place] to 2mg /week - Continue running routine, hope she can train for a 5k soon!  History of prediabetes Reversed. A1c wnl today. 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 Will obtain baseline echo.      Pearla Dubonnet, MD [place]

## 2022-04-16 ENCOUNTER — Encounter: Payer: Self-pay | Admitting: Student

## 2022-04-16 DIAGNOSIS — Z6841 Body Mass Index (BMI) 40.0 and over, adult: Secondary | ICD-10-CM

## 2022-04-16 DIAGNOSIS — Z87898 Personal history of other specified conditions: Secondary | ICD-10-CM

## 2022-04-17 MED ORDER — WEGOVY 1.7 MG/0.75ML ~~LOC~~ SOAJ: 1.7000 mg | SUBCUTANEOUS | 1 refills | Status: DC

## 2022-04-18 ENCOUNTER — Telehealth: Payer: Self-pay

## 2022-04-18 NOTE — Telephone Encounter (Signed)
A Prior Authorization was initiated for this patients WEGOVY through CoverMyMeds.   Key: [id number]

## 2022-04-22 ENCOUNTER — Ambulatory Visit (HOSPITAL_COMMUNITY)
Admission: RE | Admit: 2022-04-22 | Discharge: 2022-04-22 | Disposition: A | Discharge status: Home / Self Care | Payer: BC Managed Care – PPO | Source: Ambulatory Visit | Attending: Family Medicine | Admitting: Family Medicine

## 2022-04-22 DIAGNOSIS — R011 Cardiac murmur, unspecified: Secondary | ICD-10-CM

## 2022-04-22 DIAGNOSIS — I10 Essential (primary) hypertension: Secondary | ICD-10-CM | POA: Insufficient documentation

## 2022-04-22 LAB — ECHOCARDIOGRAM COMPLETE
AR max vel: 2.87 cm2
AV Peak grad: 10.8 mmHg
Ao pk vel: 1.64 m/s
Area-P 1/2: 3.87 cm2
S' Lateral: 2.95 cm

## 2022-04-22 NOTE — Telephone Encounter (Signed)
An appeal is available for patients WEGOVY.   Please explain why medication is medically necessary for patient?

## 2022-04-22 NOTE — Telephone Encounter (Signed)
[place]

## 2022-04-22 NOTE — Telephone Encounter (Signed)
Prior Auth for patients medication WEGOVY denied by [facility] via CoverMyMeds.   Reason: We have denied your request because you have not been taking part in a comprehensive weight management program for at least 6 months.  DENIAL LETTER SCANNED TO MEDIA  CoverMyMeds Key: [id number]

## 2022-04-24 NOTE — Telephone Encounter (Signed)
Appeal approval letter scanned to pt's media.

## 2022-04-24 NOTE — Telephone Encounter (Signed)
Received phone call from Kathlee Nations at [place] appeals department. Answered follow up questions regarding [employer] appeal. Medication approved for 7 months. We will receive fax with approval. Called pharmacy with approval. However, medication is on nationwide backorder. Called patient and provided with update.   Talbot Grumbling, RN

## 2022-05-06 ENCOUNTER — Other Ambulatory Visit: Payer: Self-pay | Admitting: Student

## 2022-05-20 ENCOUNTER — Telehealth: Payer: Self-pay | Admitting: Student

## 2022-05-20 ENCOUNTER — Encounter: Payer: Self-pay | Admitting: Student

## 2022-05-20 NOTE — Telephone Encounter (Signed)
Clinical info completed on FMLA form.  Placed form in PCP's box for completion.    When form is completed, please route note to "RN Team" and place in wall pocket in front office.   Hokulani Rogel, CMA  

## 2022-05-20 NOTE — Telephone Encounter (Signed)
Patient dropped off form at front desk for Briana Rodgers.  Verified that patient section of form has been completed.  Last DOS/WCC with PCP was 04/11/22.  Placed form in blue team folder to be completed by clinical staff.  Creig Hines

## 2022-06-18 ENCOUNTER — Encounter: Payer: Self-pay | Admitting: Student

## 2022-06-18 ENCOUNTER — Ambulatory Visit (INDEPENDENT_AMBULATORY_CARE_PROVIDER_SITE_OTHER): Payer: BC Managed Care – PPO | Admitting: Student

## 2022-06-18 ENCOUNTER — Other Ambulatory Visit (HOSPITAL_COMMUNITY)
Admission: RE | Admit: 2022-06-18 | Discharge: 2022-06-18 | Disposition: A | Discharge status: Home / Self Care | Payer: BC Managed Care – PPO | Source: Ambulatory Visit | Attending: Family Medicine | Admitting: Family Medicine

## 2022-06-18 VITALS — BP 113/74 | HR 62 | Ht 64.0 in | Wt 236.4 lb

## 2022-06-18 DIAGNOSIS — Z113 Encounter for screening for infections with a predominantly sexual mode of transmission: Secondary | ICD-10-CM

## 2022-06-18 DIAGNOSIS — F419 Anxiety disorder, unspecified: Secondary | ICD-10-CM | POA: Diagnosis not present

## 2022-06-18 NOTE — Progress Notes (Cosign Needed Addendum)
    SUBJECTIVE:   CHIEF COMPLAINT / HPI:   Routine STD testing No symptoms. Started new relationship and would like to be tested. Uses condoms.  Anxiety Works at [employer], Teacher, early years/pre, phone calls. Previous FMLA 3 hours 2 days a week- Would go home when anxious. Currently 50mg  daily, but occasionally takes 100 mg as needed. No other medication. Does not see therapy at this time- feels as though it will be difficulty- but willing to try. Stressors related to job, worries about how the day will go, she has had customers be rude and aggressive to her. Mother has renal cancer, and this has been weighing on her.  PERTINENT  PMH / PSH: Trichotillomania, HTN, prediabetes  OBJECTIVE:   BP 113/74   Pulse 62   Ht 5\' 4"  (1.626 m)   Wt 107.2 kg   LMP 05/24/2022   SpO2 99%   BMI 40.58 kg/m    General: NAD, well-appearing, pleasant GU: Normal female anatomy. White, odorless vaginal discharge- normal.  Skin: Warm and dry Psych: Anxious mood and affect  ASSESSMENT/PLAN:   Anxiety Anxiety primarily related to work. Currently taking 50 mg sertraline-plan to increase to 100 mg daily. Patient will call and make appointment with therapist. Anticipate therapy will be most beneficial change today. Previously received 3 hours x 2 days of FMLA leave per week for anxiety- plan to renew this once patient has therapy appointment. We will follow-up in 3 months to assess progress and FMLA needs. Shared decision making implemented and patient is agreeable to plan. -Sertraline 100 mg daily -Patient will call for therapy appointment -FMLA paper-work once appointment is made -Follow-up 3 months  Routine screening for STI (sexually transmitted infection) Started a new relationship, would like screening. Asymptomatic today. -GCC/Trich swab, RPR, HIV, Hep C, Hep B -Counseled on barrier protection  Follow-up recommendations for provider: 1.) Consider increasing sertraline, or adding buspar if anxiety still  poorly controlled 2.) Ensure patient has initiated therapy  , DO [facility]

## 2022-06-18 NOTE — Assessment & Plan Note (Signed)
Anxiety primarily related to work. Currently taking 50 mg sertraline-plan to increase to 100 mg daily. Patient will call and make appointment with therapist. Anticipate therapy will be most beneficial change today. Previously received 3 hours x 2 days of FMLA leave per week for anxiety- plan to renew this once patient has therapy appointment. We will follow-up in 3 months to assess progress and FMLA needs. Shared decision making implemented and patient is agreeable to plan. -Sertraline 100 mg daily -Patient will call for therapy appointment -FMLA paper-work once appointment is made -Follow-up 3 months

## 2022-06-18 NOTE — Patient Instructions (Signed)
It was great to see you! Thank you for allowing me to participate in your care!   I recommend that you always bring your medications to each appointment as this makes it easy to ensure we are on the correct medications and helps [place] not miss when refills are needed.  Our plans for today:  - Please call and make appointment to see therapist.  - Once therapy appointment has been made, please bring FMLA paperwork and the date of your therapy. I will adjust the FMLA to match with your therapy requirements. -Follow-up in 3 months to discuss your progress.  We are checking some labs today, I will call you if they are abnormal will send you a MyChart message or a letter if they are normal.  If you do not hear about your labs in the next 2 weeks please let us know.  Take care and seek immediate care sooner if you develop any concerns. Please remember to show up 15 minutes before your scheduled appointment time!  Tiffany Kocher, DO [facility]

## 2022-06-18 NOTE — Assessment & Plan Note (Addendum)
Started a new relationship, would like screening. Asymptomatic today. -GCC/Trich swab, RPR, HIV, Hep C, Hep B -Counseled on barrier protection

## 2022-06-19 LAB — HCV AB W REFLEX TO QUANT PCR: HCV Ab: NONREACTIVE

## 2022-06-19 LAB — CERVICOVAGINAL ANCILLARY ONLY
Chlamydia: NEGATIVE
Comment: NEGATIVE
Comment: NEGATIVE
Comment: NORMAL
Neisseria Gonorrhea: NEGATIVE
Trichomonas: NEGATIVE

## 2022-06-19 LAB — HEPATITIS B SURFACE ANTIGEN: Hepatitis B Surface Ag: NEGATIVE

## 2022-06-19 LAB — HCV INTERPRETATION

## 2022-06-19 LAB — HIV ANTIBODY (ROUTINE TESTING W REFLEX): HIV Screen 4th Generation wRfx: NONREACTIVE

## 2022-06-19 LAB — RPR: RPR Ser Ql: NONREACTIVE

## 2022-06-24 ENCOUNTER — Encounter: Payer: Self-pay | Admitting: Student

## 2022-07-01 NOTE — Telephone Encounter (Unsigned)
Patient dropped of FLMA paperwork to be completed by her PCP.  Also would like faxed [facility] and Disability.  Any questions call patient put forms in blue folder today

## 2022-07-17 ENCOUNTER — Telehealth: Payer: Self-pay | Admitting: Student

## 2022-07-17 NOTE — Telephone Encounter (Signed)
Paperwork needs to be signed by pcp but in blue folder again

## 2022-07-18 NOTE — Telephone Encounter (Signed)
Pt came in stating that she needed some paperwork filled out. I left the forms in your box for you to look at.

## 2022-07-21 NOTE — Telephone Encounter (Signed)
FMLA paper completed. Placed in RN box.

## 2022-07-22 ENCOUNTER — Other Ambulatory Visit: Payer: Self-pay | Admitting: Student

## 2022-07-22 DIAGNOSIS — Z6841 Body Mass Index (BMI) 40.0 and over, adult: Secondary | ICD-10-CM

## 2022-07-22 DIAGNOSIS — Z87898 Personal history of other specified conditions: Secondary | ICD-10-CM

## 2022-07-24 NOTE — Telephone Encounter (Signed)
Faxed to [place] at [phone]. Copy made and placed in batch scanning.   Talbot Grumbling, RN

## 2022-07-25 ENCOUNTER — Encounter: Payer: Self-pay | Admitting: Student

## 2022-07-25 DIAGNOSIS — Z87898 Personal history of other specified conditions: Secondary | ICD-10-CM

## 2022-07-25 DIAGNOSIS — Z6841 Body Mass Index (BMI) 40.0 and over, adult: Secondary | ICD-10-CM

## 2022-08-01 ENCOUNTER — Ambulatory Visit (INDEPENDENT_AMBULATORY_CARE_PROVIDER_SITE_OTHER): Payer: BC Managed Care – PPO | Admitting: Psychology

## 2022-08-01 DIAGNOSIS — F411 Generalized anxiety disorder: Secondary | ICD-10-CM

## 2022-08-01 DIAGNOSIS — F633 Trichotillomania: Secondary | ICD-10-CM

## 2022-08-01 NOTE — Progress Notes (Addendum)
[place] Counselor Initial Adult Exam  Name: Briana Rodgers Date: 08/01/2022 MRN: [id number] DOB: 09-08-1975 PCP: Leslie Dales, DO  Time spent: 51 mintutes  Guardian/Payee:  self  Paperwork requested: No   Reason for Visit /Presenting Problem: The patient was seen via video visit.  She gave verbal permission for the session to be on caregility.  The patient was in her home alone and therapist was in the office. The patient reports that she has been having some problems with anxiety for a while.  She went to her PCP and was referred to therapy because they felt like it would help her be able to come to some resolution with her anxiety.  She states that her anxiety is mostly related to her work situation.  She reports that she works in a very stressful environment and she has gotten to where she becomes anxious and panicky prior to going to work.  She describes feeling overwhelmed and not wanting to go to the office.  She states that she has done some deep breathing but it does not seem to help much.  Mental Status Exam: Appearance:   Casual     Behavior:  Appropriate  Motor:  Restlestness  Speech/Language:   Normal Rate  Affect:  Appropriate  Mood:  anxious  Thought process:  normal  Thought content:    WNL  Sensory/Perceptual disturbances:    WNL  Orientation:  oriented to person, place, time/date, and situation  Attention:  Good  Concentration:  Good  Memory:  WNL  Fund of knowledge:   Good  Insight:    Good  Judgment:   Good  Impulse Control:  Good    Reported Symptoms: Feels anxious, crying, difficulty motivating herself to go to work.  Risk Assessment: Danger to Self:  No Self-injurious Behavior: No Danger to Others: No Duty to Warn:no Physical Aggression / Violence:No  Access to Firearms a concern: No  Gang Involvement:No  Patient / guardian was educated about steps to take if suicide or homicide risk level increases between visits: n/a While  future psychiatric events cannot be accurately predicted, the patient does not currently require acute inpatient psychiatric care and does not currently meet [facility] involuntary commitment criteria.  Substance Abuse History: Current substance abuse: No     Past Psychiatric History:   No previous psychological problems have been observed Outpatient Providers:PCP History of Psych Hospitalization: No  Psychological Testing: None  Abuse History:  Victim of: No., N/A Report needed: No. Victim of Neglect:No. Perpetrator of No Witness / Exposure to Domestic Violence: No   Protective Services Involvement: No  Witness to [employer] Violence:  No   Family History:  Family History  Problem Relation Age of Onset   Kidney cancer Mother    Hypertension Mother     Living situation: the patient lives with their son  Sexual Orientation: Straight  Relationship Status: single  Name of spouse / other:n/A If a parent, number of children / ages: Two children - Son - Josph Macho who is 17 and Daughter - [place] who is 21  Support Systems: friends  Museum/gallery curator Stress:  Yes   Income/Employment/Disability: Employment  Armed forces logistics/support/administrative officer: No   Educational History: Education: some college  Religion/Sprituality/World View: Protestant  Any cultural differences that may affect / interfere with treatment:  not applicable   Recreation/Hobbies: runs with dog-Pablo  Stressors: Financial difficulties   Occupational concerns    Strengths: Supportive Relationships, Hopefulness, and Able to Communicate Effectively  Barriers:  difficulty finding another job that pays enough to support her family.  Pressure from work.  Legal History: Pending legal issue / charges: The patient has no significant history of legal issues. History of legal issue / charges: n/A  Medical History/Surgical History: reviewed Past Medical History:  Diagnosis Date   Hypertension     Past Surgical History:  Procedure  Laterality Date   CESAREAN SECTION      Medications: Current Outpatient Medications  Medication Sig Dispense Refill   acetaminophen (TYLENOL) 500 MG tablet Take 1,000 mg by mouth daily as needed for pain. For pain     losartan (COZAAR) 25 MG tablet TAKE 1 TABLET (25 MG TOTAL) BY MOUTH DAILY. 30 tablet 11   Semaglutide-Weight Management (WEGOVY) 1.7 MG/0.75ML SOAJ INJECT 1.7 MG INTO THE SKIN ONCE A WEEK. 0.75 mL 1   sertraline (ZOLOFT) 50 MG tablet TAKE 1 TABLET BY MOUTH EVERY DAY 90 tablet 1   No current facility-administered medications for this visit.    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 Hypervigilance (e.g., feeling constantly on edge,  experiencing concentration difficulties, having trouble falling or staying asleep, exhibiting a general  state of irritability).: (Status: maintained). Motor tension (e.g., restlessness,  tiredness, shakiness, muscle tension).:(Status: maintained).  Problems Addressed  Anxiety, Phase Of Life Problems, Anxiety  Goals 1. Learn and implement coping skills that result in a reduction of anxiety  and worry, and improved daily functioning. Objective Learn and implement calming skills to reduce overall anxiety and manage anxiety symptoms. Target Date: 08/02/2023 Frequency: weekly Progress: 0 Modality: individual  Related Interventions 1. Teach the client calming/relaxation skills (e.g., applied relaxation, progressive muscle   relaxation, cue controlled relaxation; mindful breathing; biofeedback) and how to discriminate  better between relaxation and tension; teach the client how to apply these skills to his/her daily  life (e.g., New Directions in Progressive Muscle Relaxation by Marcelyn Ditty, and  Hazlett-Stevens; Treating Generalized Anxiety Disorder by Rygh and Ida Rogue). Objective Identify, challenge, and replace biased, fearful self-talk with positive, realistic, and empowering selftalk. Target Date: 08/02/2023 Frequency: weekly Progress: 0 Modality: individual Related Interventions 1. Explore the client's schema and self-talk that mediate his/her fear response; assist him/her in  challenging the biases; replace the distorted messages with reality-based alternatives and  positive, realistic self-talk that will increase his/her self-confidence in coping with irrational  fears (see Cognitive Therapy of Anxiety Disorders by Laurence Slate). Objective Learn and implement problem-solving strategies for realistically addressing worries. Target Date: 08/02/2023 Frequency: weekly Progress: 0 Modality: individual 2. Resolve conflicted feelings and adapt to the new life circumstances. Objective Apply problem-solving skills to current circumstances. Target Date: 08/02/2023 Frequency: weekly Progress: 0 Modality: individual Related Interventions 1. Teach the client problem-resolution skills (e.g., defining the problem clearly, brainstorming  multiple solutions, listing the pros and cons of each solution, seeking input from others,  selecting and implementing a plan of action, evaluating outcome, and readjusting plan as  necessary).  3. Stabilize anxiety level while increasing ability to function on a daily  basis. 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

## 2022-08-08 ENCOUNTER — Ambulatory Visit (INDEPENDENT_AMBULATORY_CARE_PROVIDER_SITE_OTHER): Payer: BC Managed Care – PPO | Admitting: Psychology

## 2022-08-08 DIAGNOSIS — F633 Trichotillomania: Secondary | ICD-10-CM

## 2022-08-08 DIAGNOSIS — F411 Generalized anxiety disorder: Secondary | ICD-10-CM

## 2022-08-08 NOTE — Progress Notes (Signed)
[place] Counselor/Therapist Progress Note  Patient ID: Tonda Waddell, MRN: [id number],    Date: 08/08/2022  Time Spent: 60 minutes  Treatment Type: Individual Therapy  Reported Symptoms: anxiety, crying  Mental Status Exam: Appearance:  Casual     Behavior: Appropriate  Motor: Normal  Speech/Language:  Clear and Coherent  Affect: Appropriate  Mood: normal  Thought process: normal  Thought content:   WNL  Sensory/Perceptual disturbances:   WNL  Orientation: oriented to person, place, time/date, and situation  Attention: Good  Concentration: Good  Memory: WNL  Fund of knowledge:  Good  Insight:   Good  Judgment:  Good  Impulse Control: Good   Risk Assessment: Danger to Self:  No Self-injurious Behavior: No Danger to Others: No Duty to Warn:no Physical Aggression / Violence:No  Access to Firearms a concern: No  Gang Involvement:No   Subjective: The patient attended an individual therapy session via video visit.  The patient gave verbal consent for this session to be on caregility.  The patient was in her home alone and therapist was in the office.  Today we talked about the treatment plan and our plan moving forward with helping the patient deal with her anxiety.  I provided some psychoeducation today about anxiety and self talk.  We also talked about the possibility of needing to do some EMDR at some point to address a previous trauma that she experienced when she was young.  In addition I recommended that the patient learn how to meditate and do mindfulness and we talked about helping her learn how to do that if she had difficulty trying to implement that.  The patient is very motivated for therapy and seems to feel that therapy will be very helpful to her.  Interventions: Cognitive Behavioral Therapy, Mindfulness Meditation, Eye Movement Desensitization and Reprocessing (EMDR), and Insight-Oriented  Diagnosis:No diagnosis found.  Plan: Plan of Care:  Client Abilities/Strengths  Intelligent, insightful, motivated  Client Treatment Preferences  Outpatient Individual therapy every  week  Client Statement of Needs  " I need some help to learn how to manage my anxiety" Treatment Level  Outpatient Individual therapy  Symptoms   Anxiety and feels stuck related to providing economically for children with a job that she is extremely stressed by(Status: maintained). Hypervigilance (e.g., feeling constantly on edge,  experiencing concentration difficulties, having trouble falling or staying asleep, exhibiting a general  state of irritability).: (Status: maintained). Motor tension (e.g., restlessness,  tiredness, shakiness, muscle tension).:(Status: maintained).  Problems Addressed  Anxiety, Phase Of Life Problems, Anxiety  Goals 1. Learn and implement coping skills that result in a reduction of anxiety  and worry, and improved daily functioning. Objective Learn and implement calming skills to reduce overall anxiety and manage anxiety symptoms. Target Date: 08/02/2023 Frequency: weekly Progress: 0 Modality: individual  Related Interventions 1. Teach the client calming/relaxation skills (e.g., applied relaxation, progressive muscle  relaxation, cue controlled relaxation; mindful breathing; biofeedback) and how to discriminate  better between relaxation and tension; teach the client how to apply these skills to his/her daily  life (e.g., New Directions in Progressive Muscle Relaxation by Casper Harrison, and  Hazlett-Stevens; Treating Generalized Anxiety Disorder by Rygh and Amparo Bristol). Objective Identify, challenge, and replace biased, fearful self-talk with positive, realistic, and empowering selftalk. Target Date: 08/02/2023 Frequency: weekly Progress: 0 Modality: individual Related Interventions 1. Explore the client's schema and self-talk that mediate his/her fear response; assist him/her in  challenging the biases; replace the  distorted messages with reality-based  alternatives and  positive, realistic self-talk that will increase his/her self-confidence in coping with irrational  fears (see Cognitive Therapy of Anxiety Disorders by Alison Stalling). Objective Learn and implement problem-solving strategies for realistically addressing worries. Target Date: 08/02/2023 Frequency: weekly Progress: 0 Modality: individual 2. Resolve conflicted feelings and adapt to the new life circumstances. Objective Apply problem-solving skills to current circumstances. Target Date: 08/02/2023 Frequency: weekly Progress: 0 Modality: individual Related Interventions 1. Teach the client problem-resolution skills (e.g., defining the problem clearly, brainstorming  multiple solutions, listing the pros and cons of each solution, seeking input from others,  selecting and implementing a plan of action, evaluating outcome, and readjusting plan as  necessary).  3. Stabilize anxiety level while increasing ability to function on a daily  basis. 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

## 2022-08-13 ENCOUNTER — Ambulatory Visit: Payer: BC Managed Care – PPO | Admitting: Psychology

## 2022-08-16 ENCOUNTER — Ambulatory Visit: Payer: BC Managed Care – PPO | Admitting: Psychology

## 2022-08-30 ENCOUNTER — Ambulatory Visit: Payer: BC Managed Care – PPO | Admitting: Psychology

## 2022-09-03 ENCOUNTER — Ambulatory Visit (INDEPENDENT_AMBULATORY_CARE_PROVIDER_SITE_OTHER): Payer: BC Managed Care – PPO | Admitting: Psychology

## 2022-09-03 DIAGNOSIS — F411 Generalized anxiety disorder: Secondary | ICD-10-CM | POA: Diagnosis not present

## 2022-09-03 DIAGNOSIS — F633 Trichotillomania: Secondary | ICD-10-CM | POA: Diagnosis not present

## 2022-09-03 NOTE — Progress Notes (Signed)
                Amalie Koran G Kylynn Street, LCSW 

## 2022-09-12 ENCOUNTER — Ambulatory Visit: Payer: BC Managed Care – PPO | Admitting: Psychology

## 2022-09-12 NOTE — Progress Notes (Signed)
                Kemi Gell G Amadi Frady, LCSW 

## 2022-09-18 ENCOUNTER — Ambulatory Visit: Payer: BC Managed Care – PPO | Admitting: Psychology

## 2022-09-25 ENCOUNTER — Other Ambulatory Visit: Payer: Self-pay | Admitting: Student

## 2022-09-25 DIAGNOSIS — Z6841 Body Mass Index (BMI) 40.0 and over, adult: Secondary | ICD-10-CM

## 2022-09-25 DIAGNOSIS — Z87898 Personal history of other specified conditions: Secondary | ICD-10-CM

## 2022-09-27 ENCOUNTER — Ambulatory Visit: Payer: BC Managed Care – PPO | Admitting: Psychology

## 2022-10-02 ENCOUNTER — Ambulatory Visit: Payer: BC Managed Care – PPO | Admitting: Psychology

## 2022-10-10 ENCOUNTER — Ambulatory Visit: Payer: BC Managed Care – PPO | Admitting: Psychology

## 2022-10-16 ENCOUNTER — Ambulatory Visit: Payer: BC Managed Care – PPO | Admitting: Psychology

## 2022-12-02 ENCOUNTER — Ambulatory Visit
Admission: EM | Admit: 2022-12-02 | Discharge: 2022-12-02 | Disposition: A | Discharge status: Home / Self Care | Payer: BC Managed Care – PPO | Attending: Nurse Practitioner | Admitting: Nurse Practitioner

## 2022-12-02 DIAGNOSIS — S161XXA Strain of muscle, fascia and tendon at neck level, initial encounter: Secondary | ICD-10-CM | POA: Diagnosis not present

## 2022-12-02 MED ORDER — NAPROXEN 500 MG PO TABS: 500.0000 mg | ORAL_TABLET | Freq: Two times a day (BID) | ORAL | 0 refills | Status: AC

## 2022-12-02 MED ORDER — CYCLOBENZAPRINE HCL 10 MG PO TABS: 10.0000 mg | ORAL_TABLET | Freq: Two times a day (BID) | ORAL | 0 refills | Status: DC | PRN

## 2022-12-02 NOTE — Discharge Instructions (Signed)
Start naproxen every 12 hours for the next 7 days.  Take this with food Flexeril as needed.  Please note this medication make you drowsy.  Do not drink alcohol or drive while on this medication Heat to the neck as needed Rest Follow-up with your PCP if your symptoms do not improve Please go to the emergency room if you develop any worsening symptoms
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
## 2022-12-05 ENCOUNTER — Telehealth: Payer: Self-pay

## 2022-12-05 ENCOUNTER — Encounter: Payer: Self-pay | Admitting: Student

## 2022-12-05 NOTE — Telephone Encounter (Signed)
Prior Auth for patients medication WEGOVY denied by [facility] via CoverMyMeds.   Reason: Your plan only covers this drug when you experience benefits from taking the drug. We have denied your request because you did not have good outcomes from the drug. We reviewed the information we had. Your request has been denied. Your doctor can send [place] any new or missing information for [place] to review. For this drug, you may have to meet other criteria. You can request the drug policy for more details.  Patient aware of denial and knows she should schedule a weight management follow up.  CoverMyMeds Key: [id number]

## 2022-12-05 NOTE — Telephone Encounter (Signed)
A Prior Authorization was initiated for this patients WEGOVY through CoverMyMeds.   Pt may need follow up appt  Key: [id number]

## 2022-12-12 ENCOUNTER — Ambulatory Visit (INDEPENDENT_AMBULATORY_CARE_PROVIDER_SITE_OTHER): Payer: BC Managed Care – PPO | Admitting: Student

## 2022-12-12 ENCOUNTER — Encounter: Payer: Self-pay | Admitting: Student

## 2022-12-12 VITALS — BP 123/80 | HR 54 | Ht 63.0 in | Wt 229.2 lb

## 2022-12-12 DIAGNOSIS — I10 Essential (primary) hypertension: Secondary | ICD-10-CM | POA: Diagnosis not present

## 2022-12-12 DIAGNOSIS — Z6841 Body Mass Index (BMI) 40.0 and over, adult: Secondary | ICD-10-CM | POA: Diagnosis not present

## 2022-12-12 DIAGNOSIS — Z87898 Personal history of other specified conditions: Secondary | ICD-10-CM

## 2022-12-12 LAB — POCT GLYCOSYLATED HEMOGLOBIN (HGB A1C): HbA1c, POC (controlled diabetic range): 5.3 % (ref 0.0–7.0)

## 2022-12-12 MED ORDER — WEGOVY 1.7 MG/0.75ML ~~LOC~~ SOAJ: 1.7000 mg | SUBCUTANEOUS | 1 refills | Status: DC

## 2022-12-12 NOTE — Assessment & Plan Note (Addendum)
Initially started on Ozempic in 09/2021 and had 27 pound weight loss, transition to [facility] in September 2023 due to insurance.  Between 03/2022 and 12/12/2022 patient lost additional 22 pounds on Wegovy, and is continuing to lose weight.  Her weight loss has allowed her to engage in exercise and she now runs 3-4 times per week. 10/08/2021 (weight 278 lb) 04/11/2022 (weight 251 lb)  12/12/2022 (weight 229 lb) -Continue Wegovy at 1.7 mg per week.  -Can consider titration to 2.4 mg if weight loss stagnates -Continue lifestyle modifications -Follow-up in 3 months or sooner

## 2022-12-12 NOTE — Progress Notes (Signed)
    SUBJECTIVE:   CHIEF COMPLAINT / HPI:   BMI 40-44 Reports approximate 50 pound weight loss since starting GLP-1.  Her weight loss from [facility] has allowed her to reach a weight where she is comfortable with exercising, now able to run 3-4 times per week without burden.  She is making more conscious decisions with eating and watching portions.  Her A1c is stable, and decreased from prediabetic ranges prior to starting GLP-1 therapy.  Tolerating medication well, no side effects.  PERTINENT  PMH / PSH: Hypertension, anxiety  OBJECTIVE:   BP 123/80   Pulse (!) 54   Ht 5\' 3"  (1.6 m)   Wt 229 lb 3.2 oz (104 kg)   LMP 11/20/2022 (Exact Date)   SpO2 100%   BMI 40.60 kg/m    General: NAD, pleasant Cardio: RRR, no MRG. Cap Refill <2s. Respiratory: CTAB, normal wob on RA Skin: Warm and dry  ASSESSMENT/PLAN:   BMI 40.0-44.9, adult (HCC) Initially started on Ozempic in 09/2021 and had 27 pound weight loss, transition to [facility] in September 2023 due to insurance.  Between 03/2022 and 12/12/2022 patient lost additional 22 pounds on Wegovy, and is continuing to lose weight.  Her weight loss has allowed her to engage in exercise and she now runs 3-4 times per week. 10/08/2021 (weight 278 lb) 04/11/2022 (weight 251 lb)  12/12/2022 (weight 229 lb) -Continue Wegovy at 1.7 mg per week.  -Can consider titration to 2.4 mg if weight loss stagnates -Continue lifestyle modifications -Follow-up in 3 months or sooner  History of prediabetes A1C 5.3 today, remains improved from (5.7 and 6.1) on Wegovy. - Continue lifestyle modifications - Continue Wegovy  Hypertension Well-controlled, blood pressure within goal today. - Continue Cozaar  Tiffany Kocher, DO [facility]   Follow-up recommendations: Discuss colonoscopy Reassess weight loss on [facility]

## 2022-12-12 NOTE — Assessment & Plan Note (Signed)
A1C 5.3 today, remains improved from (5.7 and 6.1) on Wegovy. - Continue lifestyle modifications - Continue ZOXWRU

## 2022-12-12 NOTE — Assessment & Plan Note (Signed)
Well-controlled, blood pressure within goal today. - Continue Cozaar

## 2022-12-12 NOTE — Patient Instructions (Signed)
It was great to see you! Thank you for allowing me to participate in your care!   I recommend that you always bring your medications to each appointment as this makes it easy to ensure we are on the correct medications and helps [place] not miss when refills are needed.  Our plans for today:  - I have refilled your [facility] prescription - Continue to exercise and eat well - Follow-up in 3 months  Take care and seek immediate care sooner if you develop any concerns. Please remember to show up 15 minutes before your scheduled appointment time!  Tiffany Kocher, DO [facility]

## 2022-12-13 ENCOUNTER — Telehealth: Payer: Self-pay

## 2022-12-13 NOTE — Telephone Encounter (Signed)
A Prior Authorization was initiated for this patients WEGOVY through CoverMyMeds.   Key: [id number]

## 2022-12-16 NOTE — Telephone Encounter (Signed)
Prior Auth for patients medication WEGOVY approved by [facility] from 12/14/22 to 12/14/23.  CoverMyMeds Key: [id number]

## 2023-03-27 ENCOUNTER — Ambulatory Visit (INDEPENDENT_AMBULATORY_CARE_PROVIDER_SITE_OTHER): Payer: BC Managed Care – PPO | Admitting: Psychology

## 2023-03-27 DIAGNOSIS — F411 Generalized anxiety disorder: Secondary | ICD-10-CM | POA: Diagnosis not present

## 2023-03-27 DIAGNOSIS — F633 Trichotillomania: Secondary | ICD-10-CM
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
## 2023-04-11 ENCOUNTER — Other Ambulatory Visit: Payer: Self-pay | Admitting: Student

## 2023-04-11 DIAGNOSIS — Z6841 Body Mass Index (BMI) 40.0 and over, adult: Secondary | ICD-10-CM

## 2023-04-11 DIAGNOSIS — Z87898 Personal history of other specified conditions: Secondary | ICD-10-CM

## 2023-04-16 ENCOUNTER — Ambulatory Visit: Payer: BC Managed Care – PPO | Admitting: Psychology

## 2023-04-16 DIAGNOSIS — F411 Generalized anxiety disorder: Secondary | ICD-10-CM | POA: Diagnosis not present

## 2023-04-16 DIAGNOSIS — F633 Trichotillomania: Secondary | ICD-10-CM
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
## 2023-04-22 ENCOUNTER — Ambulatory Visit: Payer: BC Managed Care – PPO | Admitting: Student

## 2023-04-22 ENCOUNTER — Encounter: Payer: Self-pay | Admitting: Student

## 2023-04-22 VITALS — BP 124/74 | HR 60 | Ht 63.0 in | Wt 219.4 lb

## 2023-04-22 DIAGNOSIS — E6609 Other obesity due to excess calories: Secondary | ICD-10-CM

## 2023-04-22 DIAGNOSIS — I10 Essential (primary) hypertension: Secondary | ICD-10-CM | POA: Diagnosis not present

## 2023-04-22 DIAGNOSIS — E66812 Obesity, class 2: Secondary | ICD-10-CM | POA: Diagnosis not present

## 2023-04-22 DIAGNOSIS — Z6838 Body mass index (BMI) 38.0-38.9, adult: Secondary | ICD-10-CM

## 2023-04-22 DIAGNOSIS — Z0000 Encounter for general adult medical examination without abnormal findings: Secondary | ICD-10-CM

## 2023-04-22 NOTE — Patient Instructions (Addendum)
It was great to see you! Thank you for allowing me to participate in your care!   I recommend that you always bring your medications to each appointment as this makes it easy to ensure we are on the correct medications and helps [place] not miss when refills are needed.  Our plans for today:  - Continue Wegovy 1.7 mg - Continue Losartan  We are checking some labs today, I will call you if they are abnormal will send you a MyChart message or a letter if they are normal.  If you do not hear about your labs in the next 2 weeks please let us know.  Take care and seek immediate care sooner if you develop any concerns. Please remember to show up 15 minutes before your scheduled appointment time!  Tiffany Kocher, DO [facility]

## 2023-04-22 NOTE — Progress Notes (Signed)
    SUBJECTIVE:   CHIEF COMPLAINT / HPI:   Weight loss management Patient continues to make great progress in her weight loss and lifestyle modifications.  Compliant with Wegovy and without side effects.  Running 4 times per week.  HTN Patient wants to discuss continued use of losartan and her blood pressure today.  Discussed blood pressure is within goal.  She is compliant with losartan and without side effects.  OBJECTIVE:   BP 124/74   Pulse 60   Ht 5\' 3"  (1.6 m)   Wt 219 lb 6.4 oz (99.5 kg)   LMP 04/05/2023   SpO2 99%   BMI 38.86 kg/m    General: NAD, pleasant Cardio: RRR, no MRG. Cap Refill <2s. Respiratory: CTAB, normal wob on RA GI: Abdomen is soft, not tender, not distended. BS present Skin: Warm and dry  ASSESSMENT/PLAN:   Assessment & Plan Class 2 obesity due to excess calories without serious comorbidity with body mass index (BMI) of 38.0 to 38.9 in adult Patient has made significant lifestyle modifications including: Running 4 times per week, and healthy food choices. Total ~59 lb weight loss with Wegovy, not including the 27 lb weight loss she had with Ozempic prior to losing coverage with her insurance. 10/08/2021 (weight 278 lb) 04/11/2022 (weight 251 lb)  12/12/2022 (weight 229 lb) 04/22/2023 (weight 219 lb) -Continue Wegovy at 1.7 mg per week.  -Can consider titration to 2.4 mg if weight loss stagnates -Continue lifestyle modifications -Follow-up in 3 months or sooner Hypertension, unspecified type BP in goal today. Compliant with Losartan and without side-effects. Discussed continued use of Losartan, and answered patient's questions. -Continue Losartan Healthcare maintenance Plan to complete annual exam in December, appointment made. Will update labs and discuss age appropriate screening.  Briana Kocher, DO [facility]

## 2023-04-22 NOTE — Assessment & Plan Note (Signed)
BP in goal today. Compliant with Losartan and without side-effects. Discussed continued use of Losartan, and answered patient's questions. -Continue Losartan

## 2023-04-26 ENCOUNTER — Encounter: Payer: Self-pay | Admitting: Student

## 2023-04-28 ENCOUNTER — Telehealth: Payer: Self-pay | Admitting: Student

## 2023-04-28 NOTE — Telephone Encounter (Signed)
Clinical info completed on [employer]  form.  Placed form in PCP's box for completion.    When form is completed, please route note to "RN Team" and place in wall pocket in front office.   Aquilla Solian, CMA

## 2023-04-28 NOTE — Telephone Encounter (Signed)
patient dropped off form at front desk for [employer].  Verified that patient section of form has been completed.  Last DOS/WCC with PCP was 04/22/23.  Placed form in blue team folder to be completed by clinical staff.  Briana Rodgers

## 2023-04-29 ENCOUNTER — Ambulatory Visit: Payer: BC Managed Care – PPO | Admitting: Psychology

## 2023-04-29 NOTE — Progress Notes (Unsigned)
                Diona Peregoy G Denario Bagot, LCSW 

## 2023-05-01 NOTE — Telephone Encounter (Signed)
Form completed and placed in RN triage box. 

## 2023-05-01 NOTE — Telephone Encounter (Signed)
Patient called and informed that forms are ready for pick up. Copy made and placed in batch scanning. Original placed at front desk for pick up.   Patient requests that form be faxed to [employer]. ROI signed and on file.   Veronda Prude, RN

## 2023-05-14 ENCOUNTER — Ambulatory Visit: Payer: BC Managed Care – PPO | Admitting: Psychology

## 2023-05-28 ENCOUNTER — Ambulatory Visit (INDEPENDENT_AMBULATORY_CARE_PROVIDER_SITE_OTHER): Payer: BC Managed Care – PPO | Admitting: Psychology

## 2023-05-28 DIAGNOSIS — F411 Generalized anxiety disorder: Secondary | ICD-10-CM | POA: Diagnosis not present

## 2023-05-28 DIAGNOSIS — F633 Trichotillomania: Secondary | ICD-10-CM

## 2023-05-28 NOTE — Progress Notes (Unsigned)
                Rhonna Holster G Bradey Luzier, LCSW

## 2023-06-11 ENCOUNTER — Ambulatory Visit: Payer: BC Managed Care – PPO | Admitting: Psychology

## 2023-06-11 DIAGNOSIS — F633 Trichotillomania: Secondary | ICD-10-CM | POA: Diagnosis not present

## 2023-06-11 DIAGNOSIS — F411 Generalized anxiety disorder: Secondary | ICD-10-CM

## 2023-06-11 NOTE — Progress Notes (Unsigned)
                Creedence Kunesh G Remmi Armenteros, LCSW

## 2023-06-25 ENCOUNTER — Ambulatory Visit: Payer: BC Managed Care – PPO | Admitting: Psychology

## 2023-06-30 ENCOUNTER — Encounter: Payer: Self-pay | Admitting: Student

## 2023-06-30 MED ORDER — LOSARTAN POTASSIUM 25 MG PO TABS: 25.0000 mg | ORAL_TABLET | Freq: Every day | ORAL | 11 refills | Status: DC

## 2023-07-01 ENCOUNTER — Other Ambulatory Visit: Payer: Self-pay | Admitting: Student

## 2023-07-01 DIAGNOSIS — Z6841 Body Mass Index (BMI) 40.0 and over, adult: Secondary | ICD-10-CM

## 2023-07-01 DIAGNOSIS — Z87898 Personal history of other specified conditions: Secondary | ICD-10-CM

## 2023-07-09 ENCOUNTER — Ambulatory Visit: Payer: BC Managed Care – PPO | Admitting: Psychology

## 2023-07-09 DIAGNOSIS — F633 Trichotillomania: Secondary | ICD-10-CM | POA: Diagnosis not present

## 2023-07-09 DIAGNOSIS — F411 Generalized anxiety disorder: Secondary | ICD-10-CM | POA: Diagnosis not present

## 2023-07-09 NOTE — Progress Notes (Addendum)
[facility] Counselor/Therapist Progress Note  Patient ID: Malayna Micheletti, MRN: [id number],    Date: 07/09/2023  Time Spent: 48 minutes  Time in:4:02  Time out: 4:50  Treatment Type: Individual Therapy  Reported Symptoms: anxiety, crying  Mental Status Exam: Appearance:  Casual     Behavior: Appropriate  Motor: Normal  Speech/Language:  Clear and Coherent  Affect: Appropriate  Mood: normal  Thought process: normal  Thought content:   WNL  Sensory/Perceptual disturbances:   WNL  Orientation: oriented to person, place, time/date, and situation  Attention: Good  Concentration: Good  Memory: WNL  Fund of knowledge:  Good  Insight:   Good  Judgment:  Good  Impulse Control: Good   Risk Assessment: Danger to Self:  No Self-injurious Behavior: No Danger to Others: No Duty to Warn:no Physical Aggression / Violence:No  Access to Firearms a concern: No  Gang Involvement:No   Subjective: The patient attended an individual therapy session via video visit.  The patient gave verbal consent for this session to be on caregility and is aware of the limitations of telehealth..  The patient was in her home alone and therapist was in the office.   The patient presents as pleasant and cooperative.  The patient reports that she does not feel as stuck as she has felt in the past.  We talked about her job and she continues to be frustrated by her job at [employer].  She does feel like she is going to be able to get out of the job eventually.  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  We talked about some breathing techniques that she could use while she is on the job at [employer] in the moment to help herself calm down.  She seems to continue to give herself negative messages when she is on the phone with a customer and we talked about how this affects her mood and her anxiety level.   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 Hypervigilance (e.g., feeling constantly on edge,  experiencing concentration difficulties, having trouble falling or staying asleep, exhibiting a general  state of irritability).: (Status: maintained). Motor tension (e.g., restlessness,  tiredness, shakiness, muscle tension).:(Status: maintained).  Problems Addressed  Anxiety, Phase Of Life Problems, Anxiety  Goals 1. Learn and implement coping skills that result in a reduction of anxiety  and worry, and improved daily functioning. Objective Learn and implement calming skills to reduce overall anxiety and manage anxiety symptoms. Target Date: 08/01/2024 Frequency: weekly Progress: 10 Modality: individual  Related Interventions 1. Teach the client calming/relaxation skills (e.g., applied relaxation, progressive muscle  relaxation, cue controlled relaxation; mindful breathing; biofeedback) and how to discriminate  better between relaxation and tension; teach the client how to apply these skills to his/her daily  life (e.g., New Directions in Progressive Muscle Relaxation by Marcelyn Ditty, and  Hazlett-Stevens; Treating Generalized Anxiety Disorder by Rygh and Ida Rogue). Objective Identify, challenge, and replace biased, fearful self-talk with positive,  realistic, and empowering selftalk. Target Date: 08/01/2024 Frequency: weekly Progress: 10 Modality:  individual Related Interventions 1. Explore the client's schema and self-talk that mediate his/her fear response; assist him/her in  challenging the biases; replace the distorted messages with reality-based alternatives and  positive, realistic self-talk that will increase his/her self-confidence in coping with irrational  fears (see Cognitive Therapy of Anxiety Disorders by Laurence Slate). Objective Learn and implement problem-solving strategies for realistically addressing worries. Target Date: 08/01/2024 Frequency: weekly Progress: 10 Modality: individual 2. Resolve conflicted feelings and adapt to the new life circumstances. Objective Apply problem-solving skills to current circumstances. Target Date: 08/01/2024 Frequency: weekly Progress: 10 Modality: individual Related Interventions 1. Teach the client problem-resolution skills (e.g., defining the problem clearly, brainstorming  multiple solutions, listing the pros and cons of each solution, seeking input from others,  selecting and implementing a plan of action, evaluating outcome, and readjusting plan as  necessary).  3. Stabilize anxiety level while increasing ability to function on a daily  basis. 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

## 2023-07-21 ENCOUNTER — Encounter: Payer: Self-pay | Admitting: Student

## 2023-07-21 ENCOUNTER — Ambulatory Visit (INDEPENDENT_AMBULATORY_CARE_PROVIDER_SITE_OTHER): Payer: BC Managed Care – PPO | Admitting: Student

## 2023-07-21 VITALS — BP 113/67 | HR 57 | Ht 63.0 in | Wt 232.1 lb

## 2023-07-21 DIAGNOSIS — Z6838 Body mass index (BMI) 38.0-38.9, adult: Secondary | ICD-10-CM | POA: Diagnosis not present

## 2023-07-21 DIAGNOSIS — M779 Enthesopathy, unspecified: Secondary | ICD-10-CM | POA: Diagnosis not present

## 2023-07-21 DIAGNOSIS — E6609 Other obesity due to excess calories: Secondary | ICD-10-CM | POA: Diagnosis not present

## 2023-07-21 DIAGNOSIS — E66812 Obesity, class 2: Secondary | ICD-10-CM

## 2023-07-21 NOTE — Progress Notes (Signed)
    SUBJECTIVE:   CHIEF COMPLAINT / HPI: Surgical Clearance for Cheilectomy + weight loss counseling   Obesity Patient has slowed down on exercise due to weather, and noticed an increase in appetite despite Wegovy.  She has gained nearly 10 pounds.  She reports she is still sticking to a healthier diet, despite increased portions.  She plans to get back into exercise routine now that weather is improving and holidays are over.   Surgical Pre-operative Evaluation:  Procedure: Cheilectomy Procedural risk: Intermediate risk Anesthesia: General  Surgical History:  The patient denies any complication with anesthesia, bleeding, or post-operative confusion, nausea, or vomiting in prior surgical interventions. The patient denies any history of spinal surgeries. Family History: The patient denies any family history of complications with anesthesia and VTE.  Social History: Tobacco Use: Counseled Alcohol Use: None Other substance use: None  Medication review: -hold losartan day of -Do not take your Wegocvy dose on 02/07, you can take it after your surgery   Risk Stratification Tool: Chales Abrahams Calculator: 0.1 % risk of MI or cardiac event, intraoperatively or up to 30 days post-op  Functional Capacity: > 4 METS   OBJECTIVE:   BP 113/67   Pulse (!) 57   Ht 5\' 3"  (1.6 m)   Wt 232 lb 2 oz (105.3 kg)   LMP 06/05/2023   SpO2 100%   BMI 41.12 kg/m    General: NAD, pleasant Cardio: RRR, no MRG. Cap Refill <2s. Respiratory: CTAB, normal wob on RA  ASSESSMENT/PLAN:   Assessment & Plan Class 2 obesity due to excess calories without serious comorbidity with body mass index (BMI) of 38.0 to 38.9 in adult Continue Wegovy 1.7 mg daily.  Using shared decision making, patient will follow-up with me in approximately 4-6 weeks prior to increasing Wegovy dosing to give her chance to improve diet and exercise. - Follow-up in 4 to 6 weeks Bone spur Final Assessment:  The patient is at low  risk of complications from a moderate risk surgery.  I recommend the following additional tests: None I recommend the following changes to medications in the perioperative setting: Hold losartan day of surgery. Hold Wegovy at least 7 days before surgery.    Tiffany Kocher, DO [facility]

## 2023-07-21 NOTE — Patient Instructions (Signed)
It was great to see you! Thank you for allowing me to participate in your care!   I recommend that you always bring your medications to each appointment as this makes it easy to ensure we are on the correct medications and helps [place] not miss when refills are needed.  Our plans for today:  -I will complete your preoperative evaluation forms once podiatry sends a fax -Remember to not take losartan the day of your surgery -Do not take your Wegovy dose on 02/07, you can then resume it after surgery -Continue to increase your physical activity, I encourage looking for alternatives to rounding on days when it is raining and cold.  Options include biking, yoga, Pilates, etc. -We will follow-up in 1 month near the end of January to discuss weight loss management  Take care and seek immediate care sooner if you develop any concerns. Please remember to show up 15 minutes before your scheduled appointment time!  Tiffany Kocher, DO [facility]

## 2023-07-28 ENCOUNTER — Telehealth: Payer: Self-pay

## 2023-07-28 ENCOUNTER — Other Ambulatory Visit (HOSPITAL_COMMUNITY): Payer: Self-pay

## 2023-07-28 NOTE — Telephone Encounter (Signed)
 Rec'd PA request for patients Wegovy medication.   Per insurance, medication can only be prescribed by a [facility] (current insurance) medication provider.

## 2023-08-06 ENCOUNTER — Ambulatory Visit (INDEPENDENT_AMBULATORY_CARE_PROVIDER_SITE_OTHER): Payer: BC Managed Care – PPO | Admitting: Psychology

## 2023-08-06 DIAGNOSIS — F411 Generalized anxiety disorder: Secondary | ICD-10-CM

## 2023-08-06 DIAGNOSIS — F633 Trichotillomania: Secondary | ICD-10-CM | POA: Diagnosis not present

## 2023-08-06 NOTE — Progress Notes (Signed)
[facility] Counselor/Therapist Progress Note  Patient ID: Luxe Holding, MRN: [id number],    Date:  08/06/2023  Time Spent: 45 minutes  Time in:4:02  Time out: 4:47  Treatment Type: Individual Therapy  Reported Symptoms: anxiety, crying  Mental Status Exam: Appearance:  Casual     Behavior: Appropriate  Motor: Normal  Speech/Language:  Clear and Coherent  Affect: Appropriate  Mood: normal  Thought process: normal  Thought content:   WNL  Sensory/Perceptual disturbances:   WNL  Orientation: oriented to person, place, time/date, and situation  Attention: Good  Concentration: Good  Memory: WNL  Fund of knowledge:  Good  Insight:   Good  Judgment:  Good  Impulse Control: Good   Risk Assessment: Danger to Self:  No Self-injurious Behavior: No Danger to Others: No Duty to Warn:no Physical Aggression / Violence:No  Access to Firearms a concern: No  Gang Involvement:No   Subjective: The patient attended an individual therapy session via video visit.  The patient gave verbal consent for this session to be on caregility and is aware of the limitations of telehealth..  The patient was in her home alone and therapist was in the office.   The patient presents as pleasant and cooperative.  The patient reports that she still works at [employer].  She does report that she has used seeing some of the tools that we have talked about for keeping her in the present moment and for keeping her calm during her calls at work.  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  We talked about her learning more mindfulness and meditation tools to utilize to keep herself calm on a regular basis.  I taught her progressive relaxation during this session.  I also recommended that she get the book, How to Find Calm in Five Minutes a Day.  The patient also reported that she had several of her former boyfriends  to contact her and she did not have any problems at all not responding and setting limits and boundaries and I explained to her that probably the EMDR helped her have more confidence to be able to do that.   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 Hypervigilance (e.g., feeling constantly on edge,  experiencing concentration difficulties, having trouble falling or staying asleep, exhibiting a general  state of irritability).: (Status: maintained). Motor tension (e.g., restlessness,  tiredness, shakiness, muscle tension).:(Status: maintained).  Problems Addressed  Anxiety, Phase Of Life Problems, Anxiety  Goals 1. Learn and implement coping skills that result in a reduction of anxiety  and worry, and improved daily functioning. Objective Learn and implement calming skills to reduce overall anxiety and manage anxiety symptoms. Target Date: 08/01/2024 Frequency:bi weekly Progress: 20 Modality: individual  Related Interventions 1. Teach the client calming/relaxation skills (e.g., applied relaxation, progressive muscle  relaxation, cue controlled relaxation; mindful breathing; biofeedback) and how to discriminate  better between  relaxation and tension; teach the client how to apply these skills to his/her daily  life (e.g., New Directions in Progressive Muscle Relaxation by Marcelyn Ditty, and  Hazlett-Stevens; Treating Generalized Anxiety Disorder by Rygh and Ida Rogue). Objective Identify, challenge, and replace biased, fearful self-talk with positive, realistic, and empowering selftalk. Target Date: 08/01/2024 Frequency: weekly Progress: 20 Modality: individual Related Interventions 1. Explore the client's schema and self-talk that mediate his/her fear response; assist him/her in  challenging the biases; replace the distorted messages with reality-based alternatives and  positive, realistic self-talk that will increase his/her self-confidence in coping with irrational  fears (see Cognitive Therapy of Anxiety Disorders by Laurence Slate). Objective Learn and implement problem-solving strategies for realistically addressing worries. Target Date: 08/01/2024 Frequency: weekly Progress:10 Modality: individual 2. Resolve conflicted feelings and adapt to the new life circumstances. Objective Apply problem-solving skills to current circumstances. Target Date: 08/01/2024 Frequency: weekly Progress: 20 Modality: individual Related Interventions 1. Teach the client problem-resolution skills (e.g., defining the problem clearly, brainstorming  multiple solutions, listing the pros and cons of each solution, seeking input from others,  selecting and implementing a plan of action, evaluating outcome, and readjusting plan as  necessary).  3. Stabilize anxiety level while increasing ability to function on a daily  basis. 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

## 2023-08-14 ENCOUNTER — Telehealth: Payer: Self-pay

## 2023-08-14 ENCOUNTER — Other Ambulatory Visit: Payer: Self-pay | Admitting: Podiatry

## 2023-08-14 NOTE — Telephone Encounter (Signed)
Received call from [facility] at Dr. Eliane Decree office requesting office visit notes for medical clearance.   I see in PCP note he was waiting for surgical form to be faxed over. Briana Rodgers reports they do not have specific forms and just needs the OV.   Patient is scheduled for surgery on 09/03/2023.  Will fax 12/30 office visit to [phone].

## 2023-08-18 ENCOUNTER — Ambulatory Visit: Payer: BC Managed Care – PPO | Admitting: Student

## 2023-08-18 VITALS — BP 131/86 | HR 63 | Ht 63.0 in | Wt 221.0 lb

## 2023-08-18 DIAGNOSIS — Z1211 Encounter for screening for malignant neoplasm of colon: Secondary | ICD-10-CM

## 2023-08-18 DIAGNOSIS — E6609 Other obesity due to excess calories: Secondary | ICD-10-CM

## 2023-08-18 DIAGNOSIS — E66812 Obesity, class 2: Secondary | ICD-10-CM

## 2023-08-18 DIAGNOSIS — J069 Acute upper respiratory infection, unspecified: Secondary | ICD-10-CM | POA: Diagnosis not present

## 2023-08-18 DIAGNOSIS — Z6838 Body mass index (BMI) 38.0-38.9, adult: Secondary | ICD-10-CM

## 2023-08-18 NOTE — Patient Instructions (Addendum)
It was great to see you! Thank you for allowing me to participate in your care!   I recommend that you always bring your medications to each appointment as this makes it easy to ensure we are on the correct medications and helps [place] not miss when refills are needed.  Our plans for today:  - I have sent a referral to GI for colonoscopy. They will call you to schedule. - Please touch base in February about [facility] cost, I can resume the medication for you once it is affordable -Remember to not take losartan the day of your surgery -Do not take your Wegovy dose on 02/07, you can then resume it after surgery -Continue to increase your physical activity, I encourage looking for alternatives to rounding on days when it is raining and cold.  Options include biking, yoga, Pilates, etc. -We will follow-up in 2 months, March 24 at 1:30 pm.  Take care and seek immediate care sooner if you develop any concerns. Please remember to show up 15 minutes before your scheduled appointment time!  Tiffany Kocher, DO [facility]

## 2023-08-18 NOTE — Progress Notes (Signed)
    SUBJECTIVE:   CHIEF COMPLAINT / HPI:   Obesity  Weight loss Unfortunately, insurance now requiring $1200 for [facility].  She was told that this will go back to being approximately $24 when she meets her deductible.  She is close to meeting her deductible.  She has several doses of Wegovy left, which should carry through her needing to meet her deductible.  Discussed if she is unable to obtain medication, that this medication can be restarted once her insurance covers it-and pending timing may need to decrease dose.  She is back down in weight, she has increased her physical activity.  She is continuing to diet.  Health Maintenance Surgery clearance Agreeable for colonoscopy.  Patient would like a reprint of her prior surgery clearance to know which medications to hold prior to surgery.  Cold Symptoms Patient reports that she has had some viral URI symptoms.  They started on Wednesday, worse on Saturday and Sunday but have now resolved.  OBJECTIVE:   BP 131/86   Pulse 63   Ht 5\' 3"  (1.6 m)   Wt 221 lb (100.2 kg)   LMP 08/16/2023   SpO2 98%   BMI 39.15 kg/m    General: NAD, pleasant Cardio: RRR, no MRG. Cap Refill <2s. Respiratory: Slightly coarse breath sounds BL, normal wob on RA GI: Abdomen is soft, not tender, not distended. BS present    ASSESSMENT/PLAN:   Assessment & Plan Class 2 obesity due to excess calories without serious comorbidity with body mass index (BMI) of 38.0 to 38.9 in adult ~59 lb total weight loss on Wegovy.  Unfortunately, unable to afford due to insurance issue.  Will continue with remaining doses-patient will notify physician when deductible is met and medication is more formal.  Unfortunately, given hypertension patient would not be a good candidate for Contrave or other derivatives. - Continue Wegovy 1.7 mg weekly - Would consider titrating Wegovy to 2.5 mg once covered by insurance again - Continue lifestyle modifications - Follow-up in 2  months or sooner Colon cancer screening Referral to GI sent for colonoscopy. Viral URI Improving, benign exam.  Noted to have mild coarse breath sounds bilaterally. - Supportive care measures discussed - Return precautions discussed   Tiffany Kocher, DO [facility]

## 2023-08-20 ENCOUNTER — Ambulatory Visit: Payer: BC Managed Care – PPO | Admitting: Psychology

## 2023-08-25 DIAGNOSIS — M79675 Pain in left toe(s): Secondary | ICD-10-CM | POA: Diagnosis not present

## 2023-08-25 DIAGNOSIS — M2022 Hallux rigidus, left foot: Secondary | ICD-10-CM | POA: Diagnosis not present

## 2023-08-29 NOTE — Patient Instructions (Signed)
 Your procedure is scheduled on: 09/03/2023  Report to [facility] Main Entrance at  7:00   AM.  Call this number if you have problems the morning of surgery: [phone]   Remember:   Do not Eat or Drink after midnight         No Smoking the morning of surgery  :  Take these medicines the morning of surgery with A SIP OF WATER: Zoloft    Do not wear jewelry, make-up or nail polish.  Do not wear lotions, powders, or perfumes. You may wear deodorant.  Do not shave 48 hours prior to surgery. Men may shave face and neck.  Do not bring valuables to the hospital.  Contacts, dentures or bridgework may not be worn into surgery.  Leave suitcase in the car. After surgery it may be brought to your room.  For patients admitted to the hospital, checkout time is 11:00 AM the day of discharge.   Patients discharged the day of surgery will not be allowed to drive home.    Special Instructions: Shower using CHG night before surgery and shower the day of surgery use CHG.  Use special wash - you have one bottle of CHG for all showers.  You should use approximately 1/2 of the bottle for each shower. How to Use Chlorhexidine  at Home in the Shower Chlorhexidine  gluconate (CHG) is a germ-killing (antiseptic) wash that's used to clean the skin. It can get rid of the germs that normally live on the skin and can keep them away for about 24 hours. If you're having surgery, you may be told to shower with CHG at home the night before surgery. This can help lower your risk for infection. To use CHG wash in the shower, follow the steps below. Supplies needed: CHG body wash. Clean washcloth. Clean towel. How to use CHG in the shower Follow these steps unless you're told to use CHG in a different way: Start the shower. Use your normal soap and shampoo to wash your face and hair. Turn off the shower or move out of the shower stream. Pour CHG onto a clean washcloth. Do not use any type of brush or rough  sponge. Start at your neck, washing your body down to your toes. Make sure you: Wash the part of your body where the surgery will be done for at least 1 minute. Do not scrub. Do not use CHG on your head or face unless your health care provider tells you to. If it gets into your ears or eyes, rinse them well with water. Do not wash your genitals with CHG. Wash your back and under your arms. Make sure to wash skin folds. Let the CHG sit on your skin for 1-2 minutes or as long as told. Rinse your entire body in the shower, including all body creases and folds. Turn off the shower. Dry off with a clean towel. Do not put anything on your skin afterward, such as powder, lotion, or perfume. Put on clean clothes or pajamas. If it's the night before surgery, sleep in clean sheets. General tips Use CHG only as told, and follow the instructions on the label. Use the full amount of CHG as told. This is often one bottle. Do not smoke and stay away from flames after using CHG. Your skin may feel sticky after using CHG. This is normal. The sticky feeling will go away as the CHG dries. Do not use CHG: If you have a chlorhexidine  allergy or have reacted  to chlorhexidine  in the past. On open wounds or areas of skin that have broken skin, cuts, or scrapes. On babies younger than 62 months of age. Contact a health care provider if: You have questions about using CHG. Your skin gets irritated or itchy. You have a rash after using CHG. You swallow any CHG. Call your local poison control center [phone] in the [place] Your eyes itch badly, or they become very red or swollen. Your hearing changes. You have trouble seeing. If you can't reach your provider, go to an urgent care or emergency room. Do not drive yourself. Get help right away if: You have swelling or tingling in your mouth or throat. You make high-pitched whistling sounds when you breathe, most often when you breathe out (wheeze). You have  trouble breathing. These symptoms may be an emergency. Call 911 right away. Do not wait to see if the symptoms will go away. Do not drive yourself to the hospital. This information is not intended to replace advice given to you by your health care provider. Make sure you discuss any questions you have with your health care provider. Document Revised: 01/21/2023 Document Reviewed: 01/17/2022 Elsevier Patient Education  2024 [employer] Toe Deformity Repair, Care After This sheet gives you information about how to care for yourself after your procedure. Your health care provider may also give you more specific instructions. If you have problems or questions, contact your health care provider. What can I expect after the procedure? After the procedure, it is common to have: Pain in the affected area. Discomfort with walking. Follow these instructions at home: If you have a postoperative shoe:  Wear the shoe as told by your health care provider. Remove it only as told by your health care provider. Loosen the shoe if your toes tingle, become numb, or turn cold and blue. Keep the shoe clean and dry. Bathing Do not take baths, swim, or use a hot tub until your health care provider approves. Ask your health care provider if you can take showers. You may only be allowed to take sponge baths. If your postoperative shoe is not waterproof, cover it with a watertight covering when you take a bath or a shower. Keep the bandage (dressing) dry until your health care provider says it can be removed. Incision care  Follow instructions from your health care provider about how to take care of your incision. Make sure you: Wash your hands with soap and water for at least 20 seconds before and after you change your dressing. If soap and water are not available, use hand sanitizer. Change your dressing as told by your health care provider. Leave stitches (sutures), skin glue, or adhesive strips in place.  These skin closures may need to stay in place for 2 weeks or longer. If adhesive strip edges start to loosen and curl up, you may trim the loose edges. Do not remove adhesive strips completely unless your health care provider tells you to do that. Check your incision area every day for signs of infection. Check for: More redness, swelling, or pain. Fluid or blood. Warmth. Pus or a bad smell. Managing pain, stiffness, and swelling  If directed, put ice on the affected area. To do this: Put ice in a plastic bag. Place a towel between your skin and the bag. Leave the ice on for 20 minutes, 2-3 times a day. Move your toes often to avoid stiffness and to lessen swelling. Raise (elevate) the affected foot above the level  of your heart while you are sitting or lying down. Driving Ask your health care provider if the medicine prescribed to you requires you to avoid driving or using machinery. Ask your health care provider when it is safe to drive if you have a postoperative shoe on your foot. Activity Walk and return to your normal activities as told by your health care provider. Ask your health care provider what activities are safe for you. Do not use your affected foot to support your body weight until your health care provider says that you can. Use crutches as directed by your health care provider. Do exercises as told by your health care provider or physical therapist. General instructions Take over-the-counter and prescription medicines only as told by your health care provider. Ask your health care provider if the medicine prescribed to you can cause constipation. You may need to take these actions to prevent or treat constipation: Drink enough fluid to keep your urine pale yellow. Take over-the-counter or prescription medicines. Eat foods that are high in fiber, such as beans, whole grains, and fresh fruits and vegetables. Limit foods that are high in fat and processed sugars, such as  fried or sweet foods. Do not use any products that contain nicotine or tobacco, such as cigarettes, e-cigarettes, and chewing tobacco. These can delay bone healing. If you need help quitting, ask your health care provider. Keep all follow-up visits. This is important. Contact a health care provider if: You have more redness, swelling, or pain at your incision site. You notice redness extending from the surgical site upward. You have fluid or blood coming from your incision. Your incision feels warm to the touch. You have pus or a bad smell coming from the incision area or the dressing. Your leg swells. You have a fever. Get help right away if: You develop a rash. You have chest pain or difficulty breathing. These symptoms may represent a serious problem that is an emergency. Do not wait to see if the symptoms will go away. Get medical help right away. Call your local emergency services (911 in the [place] Do not drive yourself to the hospital. Summary After the procedure, it is common to have pain in the affected area and discomfort with walking. Follow instructions from your health care provider about how to take care of your incision. Do not use your affected foot to support your body weight until your health care provider says that you can. Use crutches as directed by your health care provider. Walk and return to your normal activities as told by your health care provider. Ask your health care provider what activities are safe for you. Keep all follow-up visits as told by your health care provider. This information is not intended to replace advice given to you by your health care provider. Make sure you discuss any questions you have with your health care provider. Document Revised: 10/08/2019 Document Reviewed: 10/14/2019 Elsevier Patient Education  2024 Elsevier Inc. General Anesthesia, Adult, Care After The following information offers guidance on how to care for yourself after your  procedure. Your health care provider may also give you more specific instructions. If you have problems or questions, contact your health care provider. What can I expect after the procedure? After the procedure, it is common for people to: Have pain or discomfort at the IV site. Have nausea or vomiting. Have a sore throat or hoarseness. Have trouble concentrating. Feel cold or chills. Feel weak, sleepy, or tired (fatigue). Have soreness and  body aches. These can affect parts of the body that were not involved in surgery. Follow these instructions at home: For the time period you were told by your health care provider:  Rest. Do not participate in activities where you could fall or become injured. Do not drive or use machinery. Do not drink alcohol. Do not take sleeping pills or medicines that cause drowsiness. Do not make important decisions or sign legal documents. Do not take care of children on your own. General instructions Drink enough fluid to keep your urine pale yellow. If you have sleep apnea, surgery and certain medicines can increase your risk for breathing problems. Follow instructions from your health care provider about wearing your sleep device: Anytime you are sleeping, including during daytime naps. While taking prescription pain medicines, sleeping medicines, or medicines that make you drowsy. Return to your normal activities as told by your health care provider. Ask your health care provider what activities are safe for you. Take over-the-counter and prescription medicines only as told by your health care provider. Do not use any products that contain nicotine or tobacco. These products include cigarettes, chewing tobacco, and vaping devices, such as e-cigarettes. These can delay incision healing after surgery. If you need help quitting, ask your health care provider. Contact a health care provider if: You have nausea or vomiting that does not get better with  medicine. You vomit every time you eat or drink. You have pain that does not get better with medicine. You cannot urinate or have bloody urine. You develop a skin rash. You have a fever. Get help right away if: You have trouble breathing. You have chest pain. You vomit blood. These symptoms may be an emergency. Get help right away. Call 911. Do not wait to see if the symptoms will go away. Do not drive yourself to the hospital. Summary After the procedure, it is common to have a sore throat, hoarseness, nausea, vomiting, or to feel weak, sleepy, or fatigue. For the time period you were told by your health care provider, do not drive or use machinery. Get help right away if you have difficulty breathing, have chest pain, or vomit blood. These symptoms may be an emergency. This information is not intended to replace advice given to you by your health care provider. Make sure you discuss any questions you have with your health care provider. Document Revised: 10/05/2021 Document Reviewed: 10/05/2021 Elsevier Patient Education  2024 [employer].

## 2023-09-01 ENCOUNTER — Ambulatory Visit (HOSPITAL_COMMUNITY)
Admission: RE | Admit: 2023-09-01 | Discharge: 2023-09-01 | Disposition: A | Discharge status: Home / Self Care | Payer: BC Managed Care – PPO | Source: Ambulatory Visit | Attending: Podiatry | Admitting: Podiatry

## 2023-09-01 ENCOUNTER — Encounter (HOSPITAL_COMMUNITY)
Admission: RE | Admit: 2023-09-01 | Discharge: 2023-09-01 | Disposition: A | Discharge status: Home / Self Care | Payer: BC Managed Care – PPO | Source: Ambulatory Visit | Attending: Podiatry | Admitting: Podiatry

## 2023-09-01 ENCOUNTER — Encounter (HOSPITAL_COMMUNITY): Payer: Self-pay

## 2023-09-01 VITALS — BP 131/86 | HR 63 | Temp 97.9°F | Resp 18 | Ht 63.0 in | Wt 221.0 lb

## 2023-09-01 DIAGNOSIS — I10 Essential (primary) hypertension: Secondary | ICD-10-CM | POA: Insufficient documentation

## 2023-09-01 DIAGNOSIS — Z01818 Encounter for other preprocedural examination: Secondary | ICD-10-CM

## 2023-09-01 DIAGNOSIS — Z87898 Personal history of other specified conditions: Secondary | ICD-10-CM | POA: Diagnosis not present

## 2023-09-01 DIAGNOSIS — M21612 Bunion of left foot: Secondary | ICD-10-CM | POA: Diagnosis not present

## 2023-09-01 DIAGNOSIS — M2012 Hallux valgus (acquired), left foot: Secondary | ICD-10-CM | POA: Diagnosis not present

## 2023-09-01 HISTORY — DX: Depression, unspecified: F32.A

## 2023-09-01 HISTORY — DX: Anxiety disorder, unspecified: F41.9

## 2023-09-01 HISTORY — DX: Ventricular premature depolarization: I49.3

## 2023-09-01 LAB — BASIC METABOLIC PANEL
Anion gap: 8 (ref 5–15)
BUN: 9 mg/dL (ref 6–20)
CO2: 24 mmol/L (ref 22–32)
Calcium: 9.1 mg/dL (ref 8.9–10.3)
Chloride: 107 mmol/L (ref 98–111)
Creatinine, Ser: 0.8 mg/dL (ref 0.44–1.00)
GFR, Estimated: >60 mL/min (ref >60)
Glucose, Bld: 60 mg/dL — ABNORMAL LOW (ref 70–99)
Potassium: 3.5 mmol/L (ref 3.5–5.1)
Sodium: 139 mmol/L (ref 135–145)

## 2023-09-01 LAB — HEMOGLOBIN A1C
Hgb A1c MFr Bld: 5.1 % (ref 4.8–5.6)
Mean Plasma Glucose: 99.67 mg/dL

## 2023-09-01 LAB — POCT PREGNANCY, URINE: Preg Test, Ur: NEGATIVE

## 2023-09-03 ENCOUNTER — Encounter (HOSPITAL_COMMUNITY)
Admission: RE | Disposition: A | Discharge status: Home / Self Care | Payer: Self-pay | Source: Home / Self Care | Attending: Podiatry

## 2023-09-03 ENCOUNTER — Ambulatory Visit (HOSPITAL_COMMUNITY)
Admission: RE | Admit: 2023-09-03 | Discharge: 2023-09-03 | Disposition: A | Discharge status: Home / Self Care | Payer: BC Managed Care – PPO | Attending: Podiatry | Admitting: Podiatry

## 2023-09-03 ENCOUNTER — Ambulatory Visit (HOSPITAL_COMMUNITY): Payer: BC Managed Care – PPO | Admitting: Anesthesiology

## 2023-09-03 ENCOUNTER — Ambulatory Visit: Payer: BC Managed Care – PPO | Admitting: Psychology

## 2023-09-03 ENCOUNTER — Encounter (HOSPITAL_COMMUNITY): Payer: Self-pay

## 2023-09-03 DIAGNOSIS — I10 Essential (primary) hypertension: Secondary | ICD-10-CM | POA: Insufficient documentation

## 2023-09-03 DIAGNOSIS — Z6838 Body mass index (BMI) 38.0-38.9, adult: Secondary | ICD-10-CM | POA: Diagnosis not present

## 2023-09-03 DIAGNOSIS — M79675 Pain in left toe(s): Secondary | ICD-10-CM | POA: Diagnosis not present

## 2023-09-03 DIAGNOSIS — M2022 Hallux rigidus, left foot: Secondary | ICD-10-CM | POA: Diagnosis not present

## 2023-09-03 DIAGNOSIS — E66812 Obesity, class 2: Secondary | ICD-10-CM | POA: Insufficient documentation

## 2023-09-03 DIAGNOSIS — M205X2 Other deformities of toe(s) (acquired), left foot: Secondary | ICD-10-CM | POA: Insufficient documentation

## 2023-09-03 DIAGNOSIS — Z7985 Long-term (current) use of injectable non-insulin antidiabetic drugs: Secondary | ICD-10-CM | POA: Insufficient documentation

## 2023-09-03 HISTORY — PX: CHEILECTOMY: SHX1336

## 2023-09-03 SURGERY — CHEILECTOMY
Anesthesia: General | Site: Foot | Laterality: Left

## 2023-09-03 MED ORDER — MIDAZOLAM HCL 5 MG/5ML IJ SOLN
INTRAMUSCULAR | Status: DC | PRN
Start: 2023-09-03 — End: 2023-09-03
  Administered 2023-09-03: 2 mg via INTRAVENOUS

## 2023-09-03 MED ORDER — FENTANYL CITRATE PF 50 MCG/ML IJ SOSY: 25.0000 ug | PREFILLED_SYRINGE | INTRAMUSCULAR | Status: DC | PRN

## 2023-09-03 MED ORDER — PROPOFOL 10 MG/ML IV BOLUS
INTRAVENOUS | Status: DC | PRN
  Administered 2023-09-03: 75 mg via INTRAVENOUS

## 2023-09-03 MED ORDER — LIDOCAINE HCL (PF) 2 % IJ SOLN
INTRAMUSCULAR | Status: AC
Start: 2023-09-03 — End: ?
  Filled 2023-09-03: qty 5

## 2023-09-03 MED ORDER — BUPIVACAINE HCL (PF) 0.5 % IJ SOLN
INTRAMUSCULAR | Status: AC
  Filled 2023-09-03: qty 30

## 2023-09-03 MED ORDER — LIDOCAINE HCL (CARDIAC) PF 100 MG/5ML IV SOSY
PREFILLED_SYRINGE | INTRAVENOUS | Status: DC | PRN
  Administered 2023-09-03: 50 mg via INTRATRACHEAL

## 2023-09-03 MED ORDER — KETOROLAC TROMETHAMINE 30 MG/ML IJ SOLN
INTRAMUSCULAR | Status: DC | PRN
Start: 2023-09-03 — End: 2023-09-03
  Administered 2023-09-03: 30 mg via INTRAVENOUS

## 2023-09-03 MED ORDER — MIDAZOLAM HCL 2 MG/2ML IJ SOLN
INTRAMUSCULAR | Status: AC
  Filled 2023-09-03: qty 2

## 2023-09-03 MED ORDER — PROPOFOL 500 MG/50ML IV EMUL
INTRAVENOUS | Status: DC | PRN
  Administered 2023-09-03: 50 ug/kg/min via INTRAVENOUS

## 2023-09-03 MED ORDER — ONDANSETRON HCL 4 MG/2ML IJ SOLN
INTRAMUSCULAR | Status: AC
  Filled 2023-09-03: qty 2

## 2023-09-03 MED ORDER — 0.9 % SODIUM CHLORIDE (POUR BTL) OPTIME
TOPICAL | Status: DC | PRN
  Administered 2023-09-03: 1000 mL

## 2023-09-03 MED ORDER — CEFAZOLIN SODIUM-DEXTROSE 2-4 GM/100ML-% IV SOLN
INTRAVENOUS | Status: AC
  Filled 2023-09-03: qty 100

## 2023-09-03 MED ORDER — LIDOCAINE HCL (PF) 1 % IJ SOLN
INTRAMUSCULAR | Status: AC
  Filled 2023-09-03: qty 30

## 2023-09-03 MED ORDER — CEFAZOLIN SODIUM-DEXTROSE 2-4 GM/100ML-% IV SOLN
2.0000 g | INTRAVENOUS | Status: AC
  Administered 2023-09-03: 2 g via INTRAVENOUS

## 2023-09-03 MED ORDER — FENTANYL CITRATE (PF) 100 MCG/2ML IJ SOLN
INTRAMUSCULAR | Status: DC | PRN
  Administered 2023-09-03: 50 ug via INTRAVENOUS

## 2023-09-03 MED ORDER — CHLORHEXIDINE GLUCONATE CLOTH 2 % EX PADS: 6.0000 | MEDICATED_PAD | Freq: Once | CUTANEOUS | Status: DC

## 2023-09-03 MED ORDER — MEPERIDINE HCL 50 MG/ML IJ SOLN: 6.2500 mg | INTRAMUSCULAR | Status: DC | PRN

## 2023-09-03 MED ORDER — ONDANSETRON HCL 4 MG/2ML IJ SOLN
INTRAMUSCULAR | Status: DC | PRN
  Administered 2023-09-03: 4 mg via INTRAVENOUS

## 2023-09-03 MED ORDER — METOCLOPRAMIDE HCL 5 MG/ML IJ SOLN: 10.0000 mg | Freq: Once | INTRAMUSCULAR | Status: DC | PRN

## 2023-09-03 MED ORDER — PROPOFOL 500 MG/50ML IV EMUL
INTRAVENOUS | Status: AC
  Filled 2023-09-03: qty 50

## 2023-09-03 MED ORDER — LACTATED RINGERS IV SOLN: INTRAVENOUS | Status: DC | PRN

## 2023-09-03 MED ORDER — FENTANYL CITRATE (PF) 100 MCG/2ML IJ SOLN
INTRAMUSCULAR | Status: AC
  Filled 2023-09-03: qty 2

## 2023-09-03 MED ORDER — LIDOCAINE HCL 1 % IJ SOLN
INTRAMUSCULAR | Status: DC | PRN
  Administered 2023-09-03: 10 mL via INTRAMUSCULAR
  Administered 2023-09-03: 9 mL via INTRAMUSCULAR

## 2023-09-03 SURGICAL SUPPLY — 40 items
BANDAGE ESMARK 4X12 BL STRL LF (DISPOSABLE) ×1 IMPLANT
BENZOIN TINCTURE PRP APPL 2/3 (GAUZE/BANDAGES/DRESSINGS) ×1 IMPLANT
BLADE AVERAGE 25X9 (BLADE) ×1 IMPLANT
BLADE SURG 15 STRL LF DISP TIS (BLADE) ×1 IMPLANT
BNDG CONFORM 2 STRL LF (GAUZE/BANDAGES/DRESSINGS) ×1 IMPLANT
BNDG ELASTIC 4X5.8 VLCR NS LF (GAUZE/BANDAGES/DRESSINGS) ×1 IMPLANT
BNDG ESMARK 4X12 BLUE STRL LF (DISPOSABLE) ×1 IMPLANT
BNDG GAUZE DERMACEA FLUFF 4 (GAUZE/BANDAGES/DRESSINGS) IMPLANT
BNDG GAUZE ELAST 4 BULKY (GAUZE/BANDAGES/DRESSINGS) ×1 IMPLANT
CHLORAPREP W/TINT 26 (MISCELLANEOUS) ×1 IMPLANT
COVER LIGHT HANDLE STERIS (MISCELLANEOUS) ×2 IMPLANT
CUFF TOURN SGL QUICK 18X4 (TOURNIQUET CUFF) ×1 IMPLANT
DRSG ADAPTIC 3X8 NADH LF (GAUZE/BANDAGES/DRESSINGS) ×1 IMPLANT
ELECT REM PT RETURN 9FT ADLT (ELECTROSURGICAL) ×1 IMPLANT
ELECTRODE REM PT RTRN 9FT ADLT (ELECTROSURGICAL) ×1 IMPLANT
GAUZE SPONGE 4X4 12PLY STRL (GAUZE/BANDAGES/DRESSINGS) ×1 IMPLANT
GLOVE BIO SURGEON STRL SZ7.5 (GLOVE) ×1 IMPLANT
GLOVE BIOGEL PI IND STRL 7.0 (GLOVE) ×2 IMPLANT
GLOVE BIOGEL PI IND STRL 7.5 (GLOVE) ×1 IMPLANT
GOWN STRL REUS W/ TWL LRG LVL3 (GOWN DISPOSABLE) ×1 IMPLANT
GOWN STRL REUS W/TWL LRG LVL3 (GOWN DISPOSABLE) ×2 IMPLANT
KIT TURNOVER KIT A (KITS) ×1 IMPLANT
MANIFOLD NEPTUNE II (INSTRUMENTS) ×1 IMPLANT
MARKER SKIN DUAL TIP RULER LAB (MISCELLANEOUS) ×1 IMPLANT
NDL HYPO 25X1 1.5 SAFETY (NEEDLE) ×5 IMPLANT
NEEDLE HYPO 25X1 1.5 SAFETY (NEEDLE) ×2 IMPLANT
NS IRRIG 1000ML POUR BTL (IV SOLUTION) ×1 IMPLANT
PACK BASIC LIMB (CUSTOM PROCEDURE TRAY) ×1 IMPLANT
PAD ARMBOARD 7.5X6 YLW CONV (MISCELLANEOUS) ×1 IMPLANT
POSITIONER HEAD 8X9X4 ADT (SOFTGOODS) ×1 IMPLANT
RASP SM TEAR CROSS CUT (RASP) IMPLANT
SET BASIN LINEN APH (SET/KITS/TRAYS/PACK) ×1 IMPLANT
STRIP CLOSURE SKIN 1/2X4 (GAUZE/BANDAGES/DRESSINGS) ×2 IMPLANT
SUT ETHILON 4 0 PS 2 18 (SUTURE) ×1 IMPLANT
SUT PROLENE 4 0 PS 2 18 (SUTURE) IMPLANT
SUT VIC AB 2-0 CT2 27 (SUTURE) ×1 IMPLANT
SUT VIC AB 4-0 PS2 27 (SUTURE) ×1 IMPLANT
SUT VICRYL AB 3-0 FS1 BRD 27IN (SUTURE) ×1 IMPLANT
SYR BULB IRRIG 60ML STRL (SYRINGE) ×1 IMPLANT
SYR CONTROL 10ML LL (SYRINGE) ×3 IMPLANT

## 2023-09-03 NOTE — H&P (Signed)
.  HISTORY AND PHYSICAL INTERVAL NOTE:  09/03/2023  8:07 AM  Briana Rodgers  has presented today for surgery, with the diagnosis of HALUX LIMITUS LEFT FOOT.  The various methods of treatment have been discussed with the patient.  No guarantees were given.  After consideration of risks, benefits and other options for treatment, the patient has consented to surgery.  I have reviewed the patients' chart and labs.    Patient Vitals for the past 24 hrs:  BP Temp Pulse Resp SpO2  09/03/23 0718 130/82 98.5 F (36.9 C) 68 18 100 %    A history and physical examination was performed in my office.  The patient was reexamined.  There have been no changes to this history and physical examination.  Geryl Councilman, DPM

## 2023-09-03 NOTE — Transfer of Care (Signed)
Immediate Anesthesia Transfer of Care Note  Patient: Briana Rodgers  Procedure(s) Performed: CHEILECTOMY (Left: Foot)  Patient Location: PACU  Anesthesia Type:General  Level of Consciousness: awake  Airway & Oxygen Therapy: Patient Spontanous Breathing  Post-op Assessment: Report given to RN  Post vital signs: Reviewed and stable  Last Vitals:  Vitals Value Taken Time  BP 112/76 09/03/23 0945  Temp 36.6 C 09/03/23 0940  Pulse 57 09/03/23 0952  Resp 15 09/03/23 0952  SpO2 100 % 09/03/23 0952  Vitals shown include unfiled device data.  Last Pain:  Vitals:   09/03/23 0940  PainSc: 0-No pain      Patients Stated Pain Goal: 5 (09/03/23 0940)  Complications: No notable events documented.

## 2023-09-03 NOTE — Brief Op Note (Signed)
09/03/2023  9:35 AM  PATIENT:  Briana Rodgers  47 y.o. female  PRE-OPERATIVE DIAGNOSIS:  HALUX LIMITUS LEFT FOOT  POST-OPERATIVE DIAGNOSIS:  HALUX LIMITUS LEFT FOOT  PROCEDURE:  Procedure(s): CHEILECTOMY (Left)  SURGEON:  Surgeons and Role:    * Erskine Emery, DPM - Primary  PHYSICIAN ASSISTANT:   ASSISTANTS: none   ANESTHESIA:   local and MAC  EBL:  None.   BLOOD ADMINISTERED:none  DRAINS: none   LOCAL MEDICATIONS USED:  MARCAINE   , LIDOCAINE , and Amount: 10 ml pre and 9ml post  SPECIMEN:  No Specimen  DISPOSITION OF SPECIMEN:  N/A  COUNTS:  YES  TOURNIQUET:   Total Tourniquet Time Documented: Ankle (Left) - 35 minutes Total: Ankle (Left) - 35 minutes   DICTATION: .Reubin Milan Dictation  PLAN OF CARE: Discharge to home after PACU  PATIENT DISPOSITION:  PACU - hemodynamically stable.   Delay start of Pharmacological VTE agent (>24hrs) due to surgical blood loss or risk of bleeding: not applicable

## 2023-09-03 NOTE — Anesthesia Postprocedure Evaluation (Signed)
Anesthesia Post Note  Patient: Briana Rodgers  Procedure(s) Performed: CHEILECTOMY (Left: Foot)  Patient location during evaluation: PACU Anesthesia Type: General Level of consciousness: awake and alert Pain management: pain level controlled Vital Signs Assessment: post-procedure vital signs reviewed and stable Respiratory status: spontaneous breathing, nonlabored ventilation and respiratory function stable Cardiovascular status: blood pressure returned to baseline and stable Postop Assessment: no apparent nausea or vomiting Anesthetic complications: no   No notable events documented.   Last Vitals:  Vitals:   09/03/23 1025 09/03/23 1026  BP:  (!) 140/97  Pulse:  (!) 54  Resp:  18  Temp: 36.6 C 36.6 C  SpO2:  100%    Last Pain:  Vitals:   09/03/23 1026  TempSrc: Axillary  PainSc: 0-No pain                 Roslynn Amble

## 2023-09-03 NOTE — Discharge Instructions (Addendum)
These instructions will give you an idea of what to expect after surgery and how to manage issues that may arise before your first post op office visit.  Pain Management Pain is best managed by "staying ahead" of it. If pain gets out of control, it is difficult to get it back under control. Local anesthesia that lasts 6-8 hours is used to numb the foot and decrease pain.  For the best pain control, take the pain medication every 4 hours for the first 2 days post op. On the third day pain medication can be taken as needed.   Post Op Nausea Nausea is common after surgery, so it is managed proactively.  If prescribed, use the prescribed nausea medication regularly for the first 2 days post op.  Bandages Do not worry if there is blood on the bandage. What looks like a lot of blood on the bandage is actually a small amount. Blood on the dressing spreads out as it is absorbed by the gauze, the same way a drop of water spreads out on a paper towel.  If the bandages feel wet or dry, stiff and uncomfortable, call the office during office hours and we will schedule a time for you to have the bandage changed.  Unless you are specifically told otherwise, we will do the first bandage change in the office.  Keep your bandage dry. If the bandage becomes wet or soiled, notify the office and we will schedule a time to change the bandage.  Activity It is best to spend most of the first 2 days after surgery lying down with the foot elevated above the level of your heart. You may put weight on your heel while wearing the surgical shoe.   You may only get up to go to the restroom.  Driving Do not drive until you are able to respond in an emergency (i.e. slam on the brakes). This usually occurs after the bone has healed - 6 to 8 weeks.  Call the Office If you have a fever over 101F.  If you have increasing pain after the initial post op pain has settled down.  If you have increasing redness, swelling, or  drainage.  If you have any questions or concerns.

## 2023-09-03 NOTE — Anesthesia Preprocedure Evaluation (Addendum)
Anesthesia Evaluation  Patient identified by MRN, date of birth, ID band Patient awake    Reviewed: Allergy & Precautions, H&P , NPO status , Patient's Chart, lab work & pertinent test results  Airway Mallampati: II  TM Distance: >3 FB Neck ROM: Full    Dental no notable dental hx.    Pulmonary neg pulmonary ROS   Pulmonary exam normal breath sounds clear to auscultation       Cardiovascular hypertension, Normal cardiovascular exam+ Valvular Problems/Murmurs  Rhythm:Regular Rate:Normal     Neuro/Psych  PSYCHIATRIC DISORDERS Anxiety Depression    negative neurological ROS     GI/Hepatic negative GI ROS, Neg liver ROS,,,  Endo/Other  negative endocrine ROS    Renal/GU negative Renal ROS  negative genitourinary   Musculoskeletal negative musculoskeletal ROS (+)    Abdominal  (+) + obese  Peds negative pediatric ROS (+)  Hematology negative hematology ROS (+)   Anesthesia Other Findings THC   Reproductive/Obstetrics negative OB ROS                             Anesthesia Physical Anesthesia Plan  ASA: 2  Anesthesia Plan: General   Post-op Pain Management: Minimal or no pain anticipated   Induction: Intravenous  PONV Risk Score and Plan: 1 and Propofol infusion  Airway Management Planned: Simple Face Mask and Nasal Cannula  Additional Equipment:   Intra-op Plan:   Post-operative Plan:   Informed Consent: I have reviewed the patients History and Physical, chart, labs and discussed the procedure including the risks, benefits and alternatives for the proposed anesthesia with the patient or authorized representative who has indicated his/her understanding and acceptance.       Plan Discussed with: CRNA  Anesthesia Plan Comments:        Anesthesia Quick Evaluation

## 2023-09-03 NOTE — Op Note (Signed)
09/03/2023  9:35 AM  PATIENT:  Briana Rodgers  48 y.o. female  PRE-OPERATIVE DIAGNOSIS:  HALUX LIMITUS LEFT FOOT  POST-OPERATIVE DIAGNOSIS:  HALUX LIMITUS LEFT FOOT  PROCEDURE:  Procedure(s): CHEILECTOMY Left hallux.   SURGEON:  Surgeons and Role:    * Erskine Emery, DPM - Primary  PHYSICIAN ASSISTANT:   ASSISTANTS: none   ANESTHESIA:   local and MAC  EBL:  None.   BLOOD ADMINISTERED:none  LOCAL MEDICATIONS USED:  MARCAINE   , LIDOCAINE , and Amount: 10 ml pre and 9ml post  Materials: 2-0 Vicryl, 3-0 Vicryl, 3-0 Prolene.   TOURNIQUET:   Total Tourniquet Time Documented: Ankle (Left) - 35 minutes Total: Ankle (Left) - 35 minutes   PLAN OF CARE: Discharge to home after PACU  PATIENT DISPOSITION:  PACU - hemodynamically stable.   Patient was brought into the operating room laid supine on the operating table. Ankle tourniquet was applied to the surgical extremity. Following IV sedation, a local block was achieved using 10 cc of mixture of 1% plain lidocaine with 0.5% marcaine. The foot was the prepped, scrubbed and draped in aseptic manner. Using an esmarch band the tourniquet on the surgical site was inflatted at .    Attention was then directed to the dorsomedial aspect of the left first metatarsophalangeal joint where a 6-cm linear incision was made medial and parallel to the course of the extensor hallucis longus tendon.  The incision was deepened through subcutaneous tissues.  Vital neurovascular structures were identified and retracted.  All bleeders were identified and cauterized.  The incision was deepened to the level of the first metatarsophalangeal joint capsule.  Liner capsulotomy was performed over the dorsal aspect of the first metatarsophalangeal joint.  The capsular and periosteal structures were dissected free of their osseous attachments and reflected medially and laterally thus exposing the head of the first metatarsal at the operative site. There was  prominent dorsal spur noted with arthrtic changes noted to the first metatarsal head. There was 40% erosive changes noted to the first MPJ head with yellow cartilage.  Utilizing a sagittal saw, the dorsal and medial prominence was resected and passed from the operative field.  All rough edges were smoothed.    The surgical area was copiously irrigated. The periosteal and capsular tissues were approximated with 2-0 Vicryl suture. 3-0 Vicryl was used to approximate the subcutaneous tissues. 3-0 Vicryl was used to approximate the skin in a subcuticular manner. Skin was reinforced with additional 3-0 Prolene. The tourniquet was deflated. Dry sterile dressing applied. Capillary refill time was brisk to lesser toes and surgical toe.  Patient was transferred to PACU with VSS.   Will be placed in surgical shoe with weightbearing to heel as tolerated.

## 2023-09-03 NOTE — Progress Notes (Signed)
Normal sensation in toe and foot.

## 2023-09-04 ENCOUNTER — Encounter (HOSPITAL_COMMUNITY): Payer: Self-pay | Admitting: Podiatry

## 2023-09-08 DIAGNOSIS — Z4889 Encounter for other specified surgical aftercare: Secondary | ICD-10-CM | POA: Diagnosis not present

## 2023-09-17 ENCOUNTER — Ambulatory Visit: Payer: BC Managed Care – PPO | Admitting: Psychology

## 2023-09-17 DIAGNOSIS — F411 Generalized anxiety disorder: Secondary | ICD-10-CM

## 2023-09-17 DIAGNOSIS — F633 Trichotillomania: Secondary | ICD-10-CM | POA: Diagnosis not present

## 2023-09-17 NOTE — Progress Notes (Signed)
 [facility] Counselor/Therapist Progress Note  Patient ID: Briana Rodgers, MRN: [id number],    Date:  09/17/2023  Time Spent: 58 minutes  Time in:4:00  Time out: 4:58  Treatment Type: Individual Therapy  Reported Symptoms: anxiety, crying  Mental Status Exam: Appearance:  Casual     Behavior: Appropriate  Motor: Normal  Speech/Language:  Clear and Coherent  Affect: Appropriate  Mood: normal  Thought process: normal  Thought content:   WNL  Sensory/Perceptual disturbances:   WNL  Orientation: oriented to person, place, time/date, and situation  Attention: Good  Concentration: Good  Memory: WNL  Fund of knowledge:  Good  Insight:   Good  Judgment:  Good  Impulse Control: Good   Risk Assessment: Danger to Self:  No Self-injurious Behavior: No Danger to Others: No Duty to Warn:no Physical Aggression / Violence:No  Access to Firearms a concern: No  Gang Involvement:No   Subjective: The patient attended an individual therapy session via video visit.  The patient gave verbal consent for this session to be on caregility and is aware of the limitations of telehealth..  The patient was in her home alone and therapist was in the office.   The patient presents as pleasant and cooperative.  The patient reports that she had her surgery on February 12.  She had surgery on her left big toe.  She reports that she has been feeling a little depressed lately and we talked about how medications and anesthesia can sometimes cause you to feel that way for a while after surgery.  I told her that she probably should just monitor that for now and see if it gets better.  The patient reports that she is still looking for a job other than at [employer] but she is looking for something that is going to be a better fit for her after she leaves there.  The patient seems to be managing things pretty well and we will continue to meet every 2 weeks to help her deal with her anxiety related to work when  she gets back.  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 Hypervigilance (e.g., feeling constantly on edge,  experiencing concentration difficulties, having trouble falling or staying asleep, exhibiting a general  state of irritability).: (Status: maintained). Motor tension (e.g., restlessness,  tiredness, shakiness, muscle tension).:(Status: maintained).  Problems Addressed  Anxiety, Phase Of Life Problems, Anxiety  Goals 1. Learn and implement coping skills that result in a reduction of anxiety  and worry, and improved daily functioning. Objective Learn and implement calming skills to reduce overall anxiety and manage anxiety symptoms. Target Date: 08/01/2024 Frequency:bi weekly Progress: 30 Modality: individual  Related Interventions 1. Teach the client calming/relaxation skills (e.g., applied relaxation, progressive muscle  relaxation, cue controlled relaxation; mindful breathing; biofeedback) and how to discriminate  better between relaxation and tension; teach the client how to apply these skills to his/her daily  life (e.g., New Directions in Progressive Muscle Relaxation by Marcelyn Ditty, and  Hazlett-Stevens; Treating Generalized Anxiety Disorder  by Rygh and Ida Rogue). Objective Identify, challenge, and replace biased, fearful self-talk with positive, realistic, and  empowering selftalk. Target Date: 08/01/2024 Frequency: weekly Progress: 30 Modality: individual Related Interventions 1. Explore the client's schema and self-talk that mediate his/her fear response; assist him/her in  challenging the biases; replace the distorted messages with reality-based alternatives and  positive, realistic self-talk that will increase his/her self-confidence in coping with irrational  fears (see Cognitive Therapy of Anxiety Disorders by Laurence Slate). Objective Learn and implement problem-solving strategies for realistically addressing worries. Target Date: 08/01/2024 Frequency: weekly Progress:10 Modality: individual 2. Resolve conflicted feelings and adapt to the new life circumstances. Objective Apply problem-solving skills to current circumstances. Target Date: 08/01/2024 Frequency: weekly Progress: 30 Modality: individual Related Interventions 1. Teach the client problem-resolution skills (e.g., defining the problem clearly, brainstorming  multiple solutions, listing the pros and cons of each solution, seeking input from others,  selecting and implementing a plan of action, evaluating outcome, and readjusting plan as  necessary).  3. Stabilize anxiety level while increasing ability to function on a daily  basis. Diagnosis Axis  none 300.02 (Generalized anxiety disorder) - Open - [Signifier: n/a]  Axis  none 309.28 (Adjustment disorder with mixed anxiety and depressed  mood)  Adjustment Disorder,  With Anxiety  Medications  Zoloft 50 mg qd  Conditions For Discharge Achievement of treatment goals and objectives    Ahlani Wickes G Einer Meals, LCSW

## 2023-09-18 ENCOUNTER — Encounter: Payer: Self-pay | Admitting: Student

## 2023-09-22 DIAGNOSIS — Z4889 Encounter for other specified surgical aftercare: Secondary | ICD-10-CM | POA: Diagnosis not present

## 2023-09-23 ENCOUNTER — Encounter: Payer: Self-pay | Admitting: Student

## 2023-09-23 ENCOUNTER — Other Ambulatory Visit: Payer: Self-pay | Admitting: Student

## 2023-09-23 DIAGNOSIS — Z87898 Personal history of other specified conditions: Secondary | ICD-10-CM

## 2023-09-23 DIAGNOSIS — Z6841 Body Mass Index (BMI) 40.0 and over, adult: Secondary | ICD-10-CM

## 2023-09-23 MED ORDER — WEGOVY 1.7 MG/0.75ML ~~LOC~~ SOAJ: 1.7000 mg | SUBCUTANEOUS | 1 refills | Status: DC

## 2023-09-23 NOTE — Progress Notes (Signed)
 Wegovy 1.7 mg refilled. F/u in 4-6 weeks.

## 2023-09-23 NOTE — Telephone Encounter (Signed)
 Form completed. Placed in RN triage.

## 2023-09-23 NOTE — Telephone Encounter (Signed)
 Placed in fax pile.   Copy made for scanning.   Copy placed up front for patient.

## 2023-10-01 ENCOUNTER — Ambulatory Visit: Payer: BC Managed Care – PPO | Admitting: Psychology

## 2023-10-01 ENCOUNTER — Encounter: Payer: Self-pay | Admitting: Student

## 2023-10-06 DIAGNOSIS — Z4889 Encounter for other specified surgical aftercare: Secondary | ICD-10-CM | POA: Diagnosis not present

## 2023-10-13 ENCOUNTER — Ambulatory Visit (INDEPENDENT_AMBULATORY_CARE_PROVIDER_SITE_OTHER): Payer: Self-pay | Admitting: Student

## 2023-10-13 ENCOUNTER — Encounter: Payer: Self-pay | Admitting: Student

## 2023-10-13 VITALS — BP 133/86 | HR 57 | Ht 64.0 in | Wt 228.0 lb

## 2023-10-13 DIAGNOSIS — R011 Cardiac murmur, unspecified: Secondary | ICD-10-CM

## 2023-10-13 DIAGNOSIS — I10 Essential (primary) hypertension: Secondary | ICD-10-CM

## 2023-10-13 NOTE — Patient Instructions (Signed)
 It was great to see you! Thank you for allowing me to participate in your care!   I recommend that you always bring your medications to each appointment as this makes it easy to ensure we are on the correct medications and helps [place] not miss when refills are needed.  Our plans for today:  - Continue taking Losartan 50 mg daily - Continue your diet, exercise, and Wegovy - Your murmur is likely benign, and I do not recommend further work-up at this time - Please follow-up with GI doctors about colonoscopy   Take care and seek immediate care sooner if you develop any concerns. Please remember to show up 15 minutes before your scheduled appointment time!  Tiffany Kocher, DO [facility]

## 2023-10-13 NOTE — Assessment & Plan Note (Signed)
 Well-controlled.  Anticipate weight loss will improve blood pressure. - Continue losartan 50 mg daily

## 2023-10-13 NOTE — Assessment & Plan Note (Signed)
 Known systolic murmur.  Echocardiogram unremarkable, aside from trivial mitral valve regurgitation.  Patient is otherwise asymptomatic.  Provided reassurance.

## 2023-10-13 NOTE — Progress Notes (Signed)
    SUBJECTIVE:   CHIEF COMPLAINT / HPI:   HTN Patient would like blood pressure check today.  Currently taking losartan 50 mg daily.  Reports compliance and without side effects.  Patient inquired if weight loss would help improve blood pressure, and educated patient to weight loss will likely help blood pressure-however I cannot promise that it would prevent her from needing medications in the future.  Murmur Patient reports history of murmur.  Recent echocardiogram from 1 year ago, unremarkable, trivial mitral valve regurgitation.  Patient was questioning whether or not she would need antibiotics for dental procedures moving forward-discussed that antibiotics dictated in this setting.  Weight loss Will now be seeing Dr. Allena Katz through her work, that has a weight loss program. This will allow insurance to cover her wegovy.  OBJECTIVE:   BP 133/86   Pulse (!) 57   Ht 5\' 4"  (1.626 m)   Wt 228 lb (103.4 kg)   SpO2 100%   BMI 39.14 kg/m    General: NAD, pleasant Cardio: RRR, no MRG.  2/6 systolic murmur best heard over mitral valve.  Cap Refill <2s. Respiratory: CTAB, normal wob on RA Skin: Warm and dry  ASSESSMENT/PLAN:   Assessment & Plan Hypertension, unspecified type Well-controlled.  Anticipate weight loss will improve blood pressure. - Continue losartan 50 mg daily Murmur Known systolic murmur.  Echocardiogram unremarkable, aside from trivial mitral valve regurgitation.  Patient is otherwise asymptomatic.  Provided reassurance.  Tiffany Kocher, DO [facility]

## 2023-10-15 ENCOUNTER — Ambulatory Visit (INDEPENDENT_AMBULATORY_CARE_PROVIDER_SITE_OTHER): Payer: BC Managed Care – PPO | Admitting: Psychology

## 2023-10-15 DIAGNOSIS — F633 Trichotillomania: Secondary | ICD-10-CM

## 2023-10-15 DIAGNOSIS — F411 Generalized anxiety disorder: Secondary | ICD-10-CM | POA: Diagnosis not present

## 2023-10-15 NOTE — Progress Notes (Signed)
 [facility] Counselor/Therapist Progress Note  Patient ID: Briana Rodgers Rodgers, MRN: [id number],    Date:  10/15/2023  Time Spent: 40 minutes  Time in:4:06  Time out: 4:46  Treatment Type: Individual Therapy  Reported Symptoms: anxiety, crying  Mental Status Exam: Appearance:  Casual     Behavior: Appropriate  Motor: Normal  Speech/Language:  Clear and Coherent  Affect: Appropriate  Mood: normal  Thought process: normal  Thought content:   WNL  Sensory/Perceptual disturbances:   WNL  Orientation: oriented to person, place, time/date, and situation  Attention: Good  Concentration: Good  Memory: WNL  Fund of knowledge:  Good  Insight:   Good  Judgment:  Good  Impulse Control: Good   Risk Assessment: Danger to Self:  No Self-injurious Behavior: No Danger to Others: No Duty to Warn:no Physical Aggression / Violence:No  Access to Firearms a concern: No  Gang Involvement:No   Subjective: The patient attended an individual therapy session via video visit.  The patient gave verbal consent for this session to be on caregility and is aware of the limitations of telehealth..  The patient was in her home alone and therapist was in the office.   The patient presents as pleasant and cooperative.  Patient reports that she feels like she is doing well.  She went to her doctor and she is recovering and she does not have to go back to [employer] until May 1.  We talked today about the 4 tools of discipline and talked about her procrastination issues that she has.  We talked about the need for her to establish some structure in her life at home for the month of Briana Rodgers Rodgers so that she can start looking for something different to do with her life and she felt like this session was very helpful to her today.   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 Hypervigilance (e.g., feeling constantly on edge,  experiencing concentration difficulties, having trouble falling or staying asleep, exhibiting a general  state of irritability).: (Status: maintained). Motor tension (e.g., restlessness,  tiredness, shakiness, muscle tension).:(Status: maintained).  Problems Addressed  Anxiety, Phase Of Life Problems, Anxiety  Goals 1. Learn and implement coping skills that result in a reduction of anxiety  and worry, and improved daily functioning. Objective Learn and implement calming skills to reduce overall anxiety and manage anxiety symptoms. Target Date: 08/01/2024 Frequency:bi weekly Progress: 30 Modality: individual  Related Interventions 1. Teach the client calming/relaxation skills (e.g., applied relaxation, progressive muscle  relaxation, cue controlled relaxation; mindful breathing; biofeedback) and how to discriminate  better between relaxation and tension; teach the client how to apply these skills to his/her daily  life (e.g., New Directions in Progressive Muscle Relaxation by Marcelyn Ditty, and  Hazlett-Stevens; Treating Generalized Anxiety Disorder by Rygh and Ida Rogue). Objective Identify, challenge, and replace biased, fearful self-talk with positive, realistic, and empowering selftalk. Target Date: 08/01/2024 Frequency: weekly Progress: 40 Modality: individual Related Interventions 1. Explore the client's schema and self-talk that mediate his/her fear  response; assist him/her in  challenging the biases; replace the distorted messages with reality-based alternatives and  positive, realistic self-talk that will increase his/her self-confidence in coping with irrational  fears (see Cognitive Therapy of Anxiety Disorders by Laurence Slate). Objective Learn and implement problem-solving strategies for realistically addressing worries. Target Date: 08/01/2024 Frequency: weekly Progress:10 Modality: individual 2. Resolve conflicted feelings and adapt to the new life circumstances. Objective Apply problem-solving skills to current circumstances. Target Date: 08/01/2024 Frequency: weekly Progress: 30 Modality: individual Related Interventions 1. Teach the client problem-resolution skills (e.g., defining the problem clearly, brainstorming  multiple solutions, listing the pros and cons of each solution, seeking input from others,  selecting and implementing a plan of action, evaluating outcome, and readjusting plan as  necessary).  3. Stabilize anxiety level while increasing ability to function on a daily  basis. Diagnosis Axis  none 300.02 (Generalized anxiety disorder) - Open - [Signifier: n/a]  Axis  none 309.28 (Adjustment disorder with mixed anxiety and depressed  mood)  Adjustment Disorder,  With Anxiety  Medications  Zoloft 50 mg qd  Conditions For Discharge Achievement of treatment goals and objectives    Corie Allis G Roshawnda Pecora, LCSW

## 2023-10-23 ENCOUNTER — Ambulatory Visit: Admitting: Gastroenterology

## 2023-11-12 ENCOUNTER — Ambulatory Visit: Payer: BC Managed Care – PPO | Admitting: Psychology

## 2023-11-17 DIAGNOSIS — R600 Localized edema: Secondary | ICD-10-CM | POA: Diagnosis not present

## 2023-11-17 DIAGNOSIS — Z4889 Encounter for other specified surgical aftercare: Secondary | ICD-10-CM | POA: Diagnosis not present

## 2023-11-24 ENCOUNTER — Telehealth: Payer: Self-pay | Admitting: Student

## 2023-11-24 NOTE — Telephone Encounter (Signed)
 Patient dropped off FMLA paperwork to be completed. Last DOS was 10/13/23. Placed in [name] folder

## 2023-11-24 NOTE — Telephone Encounter (Signed)
 Left some paperwork in your box that the patient needs completed.

## 2023-11-26 ENCOUNTER — Ambulatory Visit: Payer: BC Managed Care – PPO | Admitting: Psychology

## 2023-11-26 NOTE — Progress Notes (Unsigned)
                edge,  experiencing concentration difficulties, having trouble falling or staying asleep, exhibiting a general  state of irritability).: No Description Entered (Status: improved). Motor tension (e.g., restlessness,  tiredness, shakiness, muscle tension).: No Description Entered (Status: improved).  Problems Addressed  Anxiety, Phase Of Life Problems, Anxiety  Goals 1. Learn and implement coping skills that result in a reduction of anxiety  and worry, and improved daily functioning. Objective Learn  and implement calming skills to reduce overall anxiety and manage anxiety symptoms. Target Date: 2025-08-09Frequency: Weekly Progress: 40 Modality: individual  Related Interventions 1. Teach the client calming/relaxation skills (e.g., applied relaxation, progressive muscle  relaxation, cue controlled relaxation; mindful breathing; biofeedback) and how to discriminate  better between relaxation and tension; teach the client how to apply these skills to his/her daily  life (e.g., New Directions in Progressive Muscle Relaxation by Marcelyn Ditty, and  Hazlett-Stevens; Treating Generalized Anxiety Disorder by Rygh and Ida Rogue). Objective Identify, challenge, and replace biased, fearful self-talk with positive, realistic, and empowering selftalk. Target Date: 2024-02-28 Frequency: weekly Progress: 30 Modality: individual Related Interventions 1. Explore the client's schema and self-talk that mediate his/her fear response; assist him/her in  challenging the biases; replace the distorted messages with reality-based alternatives and  positive, realistic self-talk that will increase his/her self-confidence in coping with irrational  fears (see Cognitive Therapy of Anxiety Disorders by Laurence Slate). Objective Learn and implement problem-solving strategies for realistically addressing worries. Target Date: 2025-08-09Frequency: weekly Progress: 40 Modality: individual 2. Resolve conflicted feelings and adapt to the new life circumstances. Objective Apply problem-solving skills to current circumstances. Target Date: 2024-02-28 Frequency: weekly Progress: 20 Modality: individual Related Interventions 1. Teach the client problem-resolution skills (e.g., defining the problem clearly, brainstorming  multiple solutions, listing the pros and cons of each solution, seeking input from others,  selecting and implementing a plan of action, evaluating outcome, and readjusting plan as   necessary).   3. Stabilize anxiety level while increasing ability to function on a daily  basis. Diagnosis F33.1  Major depressive disorder, moderate 300.02 (Generalized anxiety disorder) - Open - [Signifier: n/a]  Axis  none 309.28 (Adjustment disorder with mixed anxiety and depressed  mood) - Open - [Signifier: n/a]  Adjustment Disorder,  With Anxiety   Marital conflict  Major Depressive disorder, moderate  Conditions For Discharge Achievement of treatment goals and objectives.  The patient approved this plan.   Deonna Krummel G Ethridge Sollenberger, LCSW

## 2023-11-27 NOTE — Telephone Encounter (Signed)
 Patient called and informed that forms are ready for pick up. Copy made and placed in batch scanning. Original placed at front desk for pick up.   Patient also requesting that forms be faxed. Faxed to provided number. ROI on file.   Elsie Halo, RN

## 2023-11-27 NOTE — Telephone Encounter (Signed)
 Forms completed. Placed in RN triage.

## 2023-12-08 DIAGNOSIS — Z4889 Encounter for other specified surgical aftercare: Secondary | ICD-10-CM | POA: Diagnosis not present

## 2023-12-10 ENCOUNTER — Ambulatory Visit (INDEPENDENT_AMBULATORY_CARE_PROVIDER_SITE_OTHER): Payer: BC Managed Care – PPO | Admitting: Psychology

## 2023-12-10 DIAGNOSIS — F633 Trichotillomania: Secondary | ICD-10-CM | POA: Diagnosis not present

## 2023-12-10 DIAGNOSIS — F411 Generalized anxiety disorder: Secondary | ICD-10-CM

## 2023-12-10 NOTE — Progress Notes (Signed)
 [facility] Counselor/Therapist Progress Note  Patient ID: Briana Rodgers, MRN: [id number],    Date:  12/10/2023  Time Spent: 33 minutes  Time in:4:05  Time out: 4:38  Treatment Type: Individual Therapy  Reported Symptoms: anxiety, crying  Mental Status Exam: Appearance:  Casual     Behavior: Appropriate  Motor: Normal  Speech/Language:  Clear and Coherent  Affect: tearful  Mood: sad  Thought process: normal  Thought content:   WNL  Sensory/Perceptual disturbances:   WNL  Orientation: oriented to person, place, time/date, and situation  Attention: Good  Concentration: Good  Memory: WNL  Fund of knowledge:  Good  Insight:   Good  Judgment:  Good  Impulse Control: Good   Risk Assessment: Danger to Self:  No Self-injurious Behavior: No Danger to Others: No Duty to Warn:no Physical Aggression / Violence:No  Access to Firearms a concern: No  Gang Involvement:No   Subjective: The patient attended an individual therapy session via video visit.  The patient gave verbal consent for this session to be on caregility and is aware of the limitations of telehealth..  The patient was in her car alone and therapist was in the office.    The patient was driving when she got on line and I encouraged her to get to a place where she could stop so that we could have the session.  The patient was tearful and depressed and reports that she went back to [employer] and she just does not feel like she can do it anymore.  We talked about the need for her to continue to look for another job and I gave her some leads two jobs that I knew about.  We talked about her going ahead and making an effort to find something else on her time off that going back in and trying to have the perspective that this is going to only be temporary and short-lived.  She reported that she would text me and let me know how things were going and she decided that she did not want to do the full session today  and wants to be seen again in 2 weeks.  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 Hypervigilance (e.g., feeling constantly on edge,  experiencing concentration difficulties, having trouble falling or staying asleep, exhibiting a general  state of irritability).: (Status: maintained). Motor tension (e.g., restlessness,  tiredness, shakiness, muscle tension).:(Status: maintained).  Problems Addressed  Anxiety, Phase Of Life Problems, Anxiety  Goals 1. Learn and implement coping skills that result in a reduction of anxiety  and worry, and improved daily functioning. Objective Learn and implement calming skills to reduce overall anxiety and manage anxiety symptoms. Target Date: 08/01/2024 Frequency:bi weekly Progress: 30 Modality: individual  Related Interventions 1. Teach the client calming/relaxation skills (e.g., applied relaxation, progressive muscle  relaxation, cue controlled relaxation; mindful breathing; biofeedback) and how to discriminate  better between relaxation and tension; teach the client how to apply these skills to his/her daily  life (e.g., New Directions in Progressive Muscle Relaxation by Fara Hone, and  Hazlett-Stevens; Treating Generalized Anxiety Disorder by Rygh and Joya Nissen). Objective Identify, challenge, and replace biased, fearful self-talk with  positive, realistic, and empowering selftalk. Target Date: 08/01/2024 Frequency: weekly Progress: 40 Modality: individual Related Interventions 1. Explore the client's schema and self-talk that mediate his/her fear response; assist him/her in  challenging the biases; replace the distorted messages with reality-based alternatives and  positive, realistic self-talk that will increase his/her self-confidence in coping with irrational  fears (see Cognitive Therapy of Anxiety Disorders by Anderson Kaufman). Objective Learn and implement problem-solving strategies for realistically addressing worries. Target Date: 08/01/2024 Frequency: weekly Progress:10 Modality: individual 2. Resolve conflicted feelings and adapt to the new life circumstances. Objective Apply problem-solving skills to current circumstances. Target Date: 08/01/2024 Frequency: weekly Progress: 30 Modality: individual Related Interventions 1. Teach the client problem-resolution skills (e.g., defining the problem clearly, brainstorming  multiple solutions, listing the pros and cons of each solution, seeking input from others,  selecting and implementing a plan of action, evaluating outcome, and readjusting plan as  necessary).  3. Stabilize anxiety level while increasing ability to function on a daily  basis. Diagnosis Axis  none 300.02 (Generalized anxiety disorder) - Open - [Signifier: n/a]  Axis  none 309.28 (Adjustment disorder with mixed anxiety and depressed  mood)  Adjustment Disorder,  With Anxiety  Medications  Zoloft  50 mg qd  Conditions For Discharge Achievement of treatment goals and objectives    Peony Barner G Doran Nestle, LCSW

## 2023-12-23 ENCOUNTER — Ambulatory Visit
Admission: RE | Admit: 2023-12-23 | Discharge: 2023-12-23 | Disposition: A | Discharge status: Home / Self Care | Source: Ambulatory Visit | Attending: Family Medicine | Admitting: Family Medicine

## 2023-12-23 VITALS — BP 141/89 | HR 67 | Temp 98.9°F | Resp 17

## 2023-12-23 DIAGNOSIS — W57XXXA Bitten or stung by nonvenomous insect and other nonvenomous arthropods, initial encounter: Secondary | ICD-10-CM

## 2023-12-23 DIAGNOSIS — L03116 Cellulitis of left lower limb: Secondary | ICD-10-CM | POA: Diagnosis not present

## 2023-12-23 MED ORDER — DOXYCYCLINE HYCLATE 100 MG PO CAPS
100.0000 mg | ORAL_CAPSULE | Freq: Two times a day (BID) | ORAL | 0 refills | Status: AC
Start: 2023-12-23 — End: ?

## 2023-12-23 MED ORDER — FLUCONAZOLE 150 MG PO TABS: 150.0000 mg | ORAL_TABLET | ORAL | 0 refills | Status: AC

## 2023-12-23 NOTE — ED Triage Notes (Signed)
 Pt reports partial tick removal to left posterior knee area yesterday-NAD-steady gait

## 2023-12-23 NOTE — ED Provider Notes (Signed)
 [facility]  Note:  This document was prepared using Conservation officer, historic buildings and may include unintentional dictation errors.  MRN: [id number] DOB: 06-18-1976  Subjective:   Briana Rodgers is a 48 y.o. female presenting for 2-day history of persistent pain over the back of the right lower leg.  Patient reports that she randomly noticed that she had to take embedded and engorged to that area.  She thought it was a mole but when she realized that it was a tick she quickly and aggressively removed it using tweezers.  Does not know how long it was embedded.  Believes it was a [employer] tick.  No current facility-administered medications for this encounter.  Current Outpatient Medications:    doxycycline (VIBRAMYCIN) 100 MG capsule, Take 1 capsule (100 mg total) by mouth 2 (two) times daily., Disp: 20 capsule, Rfl: 0   fluconazole  (DIFLUCAN ) 150 MG tablet, Take 1 tablet (150 mg total) by mouth every 3 (three) days., Disp: 3 tablet, Rfl: 0   losartan  (COZAAR ) 25 MG tablet, Take 1 tablet (25 mg total) by mouth daily., Disp: 30 tablet, Rfl: 11   oxyCODONE -acetaminophen  (PERCOCET/ROXICET) 5-325 MG tablet, Take 1 tablet by mouth every 4 (four) hours as needed for severe pain (pain score 7-10)., Disp: , Rfl:    Semaglutide -Weight Management (WEGOVY ) 1.7 MG/0.75ML SOAJ, Inject 1.7 mg into the skin once a week., Disp: 3 mL, Rfl: 1   sertraline  (ZOLOFT ) 50 MG tablet, TAKE 1 TABLET BY MOUTH EVERY DAY, Disp: 90 tablet, Rfl: 1   Allergies  Allergen Reactions   Sulfa Antibiotics Swelling and Rash    Itchy watery eyes     Past Medical History:  Diagnosis Date   Anxiety    Depression    Hypertension    PVC's (premature ventricular contractions)      Past Surgical History:  Procedure Laterality Date   CESAREAN SECTION     x 2   CHEILECTOMY Left 09/03/2023   Procedure: CHEILECTOMY;  Surgeon: Barbra Ley, DPM;  Location: [facility];  Service: Podiatry;  Laterality:  Left;   wisdom teeth removal      Family History  Problem Relation Age of Onset   Kidney cancer Mother    Hypertension Mother     Social History   Tobacco Use   Smoking status: Never   Smokeless tobacco: Never  Substance Use Topics   Alcohol use: Yes    Comment: occ   Drug use: Not Currently    Types: Marijuana    ROS   Objective:   Vitals: BP (!) 141/89 (BP Location: Left Arm)   Pulse 67   Temp 98.9 F (37.2 C)   Resp 17   SpO2 97%   Physical Exam Constitutional:      General: She is not in acute distress.    Appearance: Normal appearance. She is well-developed. She is not ill-appearing, toxic-appearing or diaphoretic.  HENT:     Head: Normocephalic and atraumatic.     Nose: Nose normal.     Mouth/Throat:     Mouth: Mucous membranes are moist.  Eyes:     General: No scleral icterus.       Right eye: No discharge.        Left eye: No discharge.     Extraocular Movements: Extraocular movements intact.  Cardiovascular:     Rate and Rhythm: Normal rate.  Pulmonary:     Effort: Pulmonary effort is normal.  Skin:    General:  Skin is warm and dry.       Neurological:     General: No focal deficit present.     Mental Status: She is alert and oriented to person, place, and time.  Psychiatric:        Mood and Affect: Mood normal.        Behavior: Behavior normal.          Assessment and Plan :   PDMP not reviewed this encounter.  1. Cellulitis of leg, left   2. Tick bite, initial encounter    Will cover for cellulitis and secondary tickborne illness with doxycycline.  Recommend general wound care.  Counseled patient on potential for adverse effects with medications prescribed/recommended today, ER and return-to-clinic precautions discussed, patient verbalized understanding.    Adolph Hoop, [place] 12/23/23 1635

## 2023-12-24 ENCOUNTER — Ambulatory Visit: Admitting: Psychology

## 2023-12-24 ENCOUNTER — Ambulatory Visit: Admitting: Gastroenterology

## 2023-12-29 ENCOUNTER — Telehealth: Payer: Self-pay

## 2023-12-29 NOTE — Telephone Encounter (Signed)
 Pharmacy Patient Advocate Encounter   Received notification from CoverMyMeds that prior authorization for WEGOVY  1.7MG  is required/requested.   Insurance verification completed.   The patient is insured through [facility] .   PA required; PA submitted to above mentioned insurance via CoverMyMeds Key/confirmation #/EOC [employer]. Status is pending

## 2023-12-30 ENCOUNTER — Other Ambulatory Visit: Payer: Self-pay | Admitting: Student

## 2023-12-30 DIAGNOSIS — Z6841 Body Mass Index (BMI) 40.0 and over, adult: Secondary | ICD-10-CM

## 2023-12-30 DIAGNOSIS — Z87898 Personal history of other specified conditions: Secondary | ICD-10-CM

## 2023-12-30 NOTE — Telephone Encounter (Signed)
 Pharmacy Patient Advocate Encounter  Received notification from [facility] that Prior Authorization for WEGOVY  has been APPROVED from 12/30/23 to 12/29/24

## 2024-01-07 ENCOUNTER — Ambulatory Visit: Admitting: Psychology

## 2024-01-07 NOTE — Progress Notes (Unsigned)
                edge,  experiencing concentration difficulties, having trouble falling or staying asleep, exhibiting a general  state of irritability).: No Description Entered (Status: improved). Motor tension (e.g., restlessness,  tiredness, shakiness, muscle tension).: No Description Entered (Status: improved).  Problems Addressed  Anxiety, Phase Of Life Problems, Anxiety  Goals 1. Learn and implement coping skills that result in a reduction of anxiety  and worry, and improved daily functioning. Objective Learn  and implement calming skills to reduce overall anxiety and manage anxiety symptoms. Target Date: 2025-08-09Frequency: Weekly Progress: 40 Modality: individual  Related Interventions 1. Teach the client calming/relaxation skills (e.g., applied relaxation, progressive muscle  relaxation, cue controlled relaxation; mindful breathing; biofeedback) and how to discriminate  better between relaxation and tension; teach the client how to apply these skills to his/her daily  life (e.g., New Directions in Progressive Muscle Relaxation by Marcelyn Ditty, and  Hazlett-Stevens; Treating Generalized Anxiety Disorder by Rygh and Ida Rogue). Objective Identify, challenge, and replace biased, fearful self-talk with positive, realistic, and empowering selftalk. Target Date: 2024-02-28 Frequency: weekly Progress: 30 Modality: individual Related Interventions 1. Explore the client's schema and self-talk that mediate his/her fear response; assist him/her in  challenging the biases; replace the distorted messages with reality-based alternatives and  positive, realistic self-talk that will increase his/her self-confidence in coping with irrational  fears (see Cognitive Therapy of Anxiety Disorders by Laurence Slate). Objective Learn and implement problem-solving strategies for realistically addressing worries. Target Date: 2025-08-09Frequency: weekly Progress: 40 Modality: individual 2. Resolve conflicted feelings and adapt to the new life circumstances. Objective Apply problem-solving skills to current circumstances. Target Date: 2024-02-28 Frequency: weekly Progress: 20 Modality: individual Related Interventions 1. Teach the client problem-resolution skills (e.g., defining the problem clearly, brainstorming  multiple solutions, listing the pros and cons of each solution, seeking input from others,  selecting and implementing a plan of action, evaluating outcome, and readjusting plan as   necessary).   3. Stabilize anxiety level while increasing ability to function on a daily  basis. Diagnosis F33.1  Major depressive disorder, moderate 300.02 (Generalized anxiety disorder) - Open - [Signifier: n/a]  Axis  none 309.28 (Adjustment disorder with mixed anxiety and depressed  mood) - Open - [Signifier: n/a]  Adjustment Disorder,  With Anxiety   Marital conflict  Major Depressive disorder, moderate  Conditions For Discharge Achievement of treatment goals and objectives.  The patient approved this plan.   Deonna Krummel G Ethridge Sollenberger, LCSW

## 2024-01-21 ENCOUNTER — Ambulatory Visit: Admitting: Psychology

## 2024-02-04 ENCOUNTER — Ambulatory Visit: Admitting: Psychology

## 2024-02-12 ENCOUNTER — Ambulatory Visit: Admitting: Psychology

## 2024-02-12 DIAGNOSIS — F633 Trichotillomania: Secondary | ICD-10-CM

## 2024-02-12 DIAGNOSIS — F411 Generalized anxiety disorder: Secondary | ICD-10-CM

## 2024-02-12 NOTE — Progress Notes (Signed)
 [facility] Counselor/Therapist Progress Note  Patient ID: Briana Rodgers, MRN: [id number],    Date:  02/12/2024  Time Spent: 53 minutes  Time in:8:07 Time out: 9:04  Treatment Type: Individual Therapy  Reported Symptoms: anxiety, crying  Mental Status Exam: Appearance:  Casual     Behavior: Appropriate  Motor: Normal  Speech/Language:  Clear and Coherent  Affect: blunted  Mood: pleasant  Thought process: normal  Thought content:   WNL  Sensory/Perceptual disturbances:   WNL  Orientation: oriented to person, place, time/date, and situation  Attention: Good  Concentration: Good  Memory: WNL  Fund of knowledge:  Good  Insight:   Good  Judgment:  Good  Impulse Control: Good   Risk Assessment: Danger to Self:  No Self-injurious Behavior: No Danger to Others: No Duty to Warn:no Physical Aggression / Violence:No  Access to Firearms a concern: No  Gang Involvement:No   Subjective: The patient attended an individual therapy session via video visit.  The patient gave verbal consent for this session to be on caregility and is aware of the limitations of telehealth..  The patient was in her home alone and therapist was in the office.  The patient presents with a blunted affect and mood is pleasant.  The patient reports that she feels like she is doing better with her job now.  She does report that she did stop working at her part-time job.  We talked about this being good for her and during the session today we discussed how everything has stress attached to it even good things.  We did do the life events she survey and she was well above the maximum for chronic stress.  We talked about the thing that she did with quitting her part-time job was exactly what we talked about in terms of managing things better.  In addition we talked about a relationship that she is involved in and we did some work around Special educational needs teacher and boundaries.   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 Hypervigilance (e.g., feeling constantly on edge,  experiencing concentration difficulties, having trouble falling or staying asleep, exhibiting a general  state of irritability).: (Status: maintained). Motor tension (e.g., restlessness,  tiredness, shakiness, muscle tension).:(Status: maintained).  Problems Addressed  Anxiety, Phase Of Life Problems, Anxiety  Goals 1. Learn and implement coping skills that result in a reduction of anxiety  and worry, and improved daily functioning. Objective Learn and implement calming skills to reduce overall anxiety and manage anxiety symptoms. Target Date: 08/01/2024 Frequency:bi weekly Progress: 40 Modality: individual  Related Interventions 1. Teach the client calming/relaxation skills (e.g., applied relaxation, progressive muscle  relaxation, cue controlled relaxation; mindful breathing; biofeedback) and how to discriminate  better between relaxation and tension; teach the client how to apply these skills to his/her daily  life (e.g., New Directions in Progressive Muscle Relaxation by Thornell Collier, and  Hazlett-Stevens; Treating Generalized Anxiety Disorder by Rygh and Red). Objective Identify, challenge, and replace biased, fearful self-talk with positive, realistic, and  empowering selftalk. Target Date: 08/01/2024 Frequency:  weekly Progress: 50 Modality: individual Related Interventions 1. Explore the client's schema and self-talk that mediate his/her fear response; assist him/her in  challenging the biases; replace the distorted messages with reality-based alternatives and  positive, realistic self-talk that will increase his/her self-confidence in coping with irrational  fears (see Cognitive Therapy of Anxiety Disorders by Gretta armin Mon). Objective Learn and implement problem-solving strategies for realistically addressing worries. Target Date: 08/01/2024 Frequency: weekly Progress:10 Modality: individual 2. Resolve conflicted feelings and adapt to the new life circumstances. Objective Apply problem-solving skills to current circumstances. Target Date: 08/01/2024 Frequency: weekly Progress: 40 Modality: individual Related Interventions 1. Teach the client problem-resolution skills (e.g., defining the problem clearly, brainstorming  multiple solutions, listing the pros and cons of each solution, seeking input from others,  selecting and implementing a plan of action, evaluating outcome, and readjusting plan as  necessary).  3. Stabilize anxiety level while increasing ability to function on a daily  basis. Diagnosis Axis  none 300.02 (Generalized anxiety disorder) - Open - [Signifier: n/a]  Axis  none 309.28 (Adjustment disorder with mixed anxiety and depressed  mood)  Adjustment Disorder,  With Anxiety  Medications  Zoloft  50 mg qd  Conditions For Discharge Achievement of treatment goals and objectives    Athina Fahey G Willis Kuipers, LCSW

## 2024-02-18 ENCOUNTER — Ambulatory Visit: Admitting: Psychology

## 2024-02-23 ENCOUNTER — Ambulatory Visit
Admission: RE | Admit: 2024-02-23 | Discharge: 2024-02-23 | Disposition: A | Discharge status: Home / Self Care | Source: Ambulatory Visit | Attending: Family Medicine | Admitting: Family Medicine

## 2024-02-23 ENCOUNTER — Other Ambulatory Visit: Payer: Self-pay

## 2024-02-23 ENCOUNTER — Ambulatory Visit: Admitting: Gastroenterology

## 2024-02-23 VITALS — BP 125/81 | HR 52 | Temp 99.2°F | Resp 16

## 2024-02-23 DIAGNOSIS — H6991 Unspecified Eustachian tube disorder, right ear: Secondary | ICD-10-CM

## 2024-02-23 MED ORDER — FLUTICASONE PROPIONATE 50 MCG/ACT NA SUSP: 1.0000 | Freq: Every day | NASAL | 0 refills | Status: AC

## 2024-02-23 MED ORDER — PREDNISONE 20 MG PO TABS: 40.0000 mg | ORAL_TABLET | Freq: Every day | ORAL | 0 refills | Status: AC

## 2024-02-23 NOTE — ED Provider Notes (Signed)
 [facility]    CSN: [id number] Arrival date & time: 02/23/24  1038      History   Chief Complaint Chief Complaint  Patient presents with   Ear Drainage    Ear and throat - Entered by patient    HPI Briana Rodgers is a 48 y.o. female with a past medical history of hypertension, PVCs, anxiety and depression presents for ear pain.  Patient reports 1 week of a persistent right ear pain as well as right-sided throat pain and a crackling noise in the right ear.  States she thought she may have had some drainage yesterday because when she used a Q-tip it was moist when she removed it.  Denies any fevers, chills, URI symptoms.  No recent swimming or excessive water in ear.  Eating and drinking normally.  No OTC treatments have been used since symptom onset.  No other concerns at this time   Ear Drainage    Past Medical History:  Diagnosis Date   Anxiety    Depression    Hypertension    PVC's (premature ventricular contractions)     Patient Active Problem List   Diagnosis Date Noted   Routine screening for STI (sexually transmitted infection) 06/18/2022   BMI 40.0-44.9, adult (HCC) 04/12/2022   Murmur 04/12/2022   History of prediabetes 06/04/2021   Anxiety 08/28/2020   Stress at work 08/18/2020   Hypertension 05/08/2020    Past Surgical History:  Procedure Laterality Date   CESAREAN SECTION     x 2   CHEILECTOMY Left 09/03/2023   Procedure: CHEILECTOMY;  Surgeon: Tobie Buckles, DPM;  Location: [facility];  Service: Podiatry;  Laterality: Left;   wisdom teeth removal      OB History   No obstetric history on file.      Home Medications    Prior to Admission medications   Medication Sig Start Date End Date Taking? Authorizing Provider  fluticasone  (FLONASE ) 50 MCG/ACT nasal spray Place 1 spray into both nostrils daily. 02/23/24  Yes Polette Nofsinger, Jodi R, NP  predniSONE  (DELTASONE ) 20 MG tablet Take 2 tablets (40 mg total) by mouth daily with breakfast for 5 days.  02/23/24 02/28/24 Yes Kherington Meraz, Jodi R, NP  doxycycline  (VIBRAMYCIN ) 100 MG capsule Take 1 capsule (100 mg total) by mouth 2 (two) times daily. 12/23/23   Christopher Savannah, PA-C  fluconazole  (DIFLUCAN ) 150 MG tablet Take 1 tablet (150 mg total) by mouth every 3 (three) days. 12/23/23   Christopher Savannah, PA-C  losartan  (COZAAR ) 25 MG tablet Take 1 tablet (25 mg total) by mouth daily. 06/30/23   Howell Lunger, DO  oxyCODONE -acetaminophen  (PERCOCET/ROXICET) 5-325 MG tablet Take 1 tablet by mouth every 4 (four) hours as needed for severe pain (pain score 7-10).    [provider]  Semaglutide -Weight Management (WEGOVY ) 1.7 MG/0.75ML SOAJ INJECT 1.7 MG INTO THE SKIN ONCE A WEEK. 12/30/23   Howell Lunger, DO  sertraline  (ZOLOFT ) 50 MG tablet TAKE 1 TABLET BY MOUTH EVERY DAY 05/06/22   Howell Lunger, DO    Family History Family History  Problem Relation Age of Onset   Kidney cancer Mother    Hypertension Mother     Social History Social History   Tobacco Use   Smoking status: Never   Smokeless tobacco: Never  Substance Use Topics   Alcohol use: Yes    Comment: occ   Drug use: Not Currently    Types: Marijuana     Allergies   Sulfa antibiotics  Review of Systems Review of Systems  HENT:  Positive for ear pain and sore throat.      Physical Exam Triage Vital Signs ED Triage Vitals  Encounter Vitals Group     BP 02/23/24 1135 125/81     Girls Systolic BP Percentile --      Girls Diastolic BP Percentile --      Boys Systolic BP Percentile --      Boys Diastolic BP Percentile --      Pulse Rate 02/23/24 1135 (!) 52     Resp 02/23/24 1135 16     Temp 02/23/24 1135 99.2 F (37.3 C)     Temp Source 02/23/24 1135 Oral     SpO2 02/23/24 1135 96 %     Weight --      Height --      Head Circumference --      Peak Flow --      Pain Score 02/23/24 1133 3     Pain Loc --      Pain Education --      Exclude from Growth Chart --    No data found.  Updated Vital Signs BP 125/81    Pulse (!) 52   Temp 99.2 F (37.3 C) (Oral)   Resp 16   LMP 02/03/2024   SpO2 96%   Visual Acuity Right Eye Distance:   Left Eye Distance:   Bilateral Distance:    Right Eye Near:   Left Eye Near:    Bilateral Near:     Physical Exam Vitals and nursing note reviewed.  Constitutional:      General: She is not in acute distress.    Appearance: Normal appearance. She is well-developed. She is not ill-appearing.  HENT:     Head: Normocephalic and atraumatic.     Right Ear: Ear canal normal. No drainage or swelling. A middle ear effusion is present. No mastoid tenderness. Tympanic membrane is not erythematous.     Left Ear: Tympanic membrane and ear canal normal.     Nose: No congestion.     Mouth/Throat:     Mouth: Mucous membranes are moist.     Pharynx: Oropharynx is clear. Uvula midline. Postnasal drip present. No pharyngeal swelling, oropharyngeal exudate, posterior oropharyngeal erythema or uvula swelling.     Tonsils: No tonsillar exudate or tonsillar abscesses.     Comments: No adenopathy Eyes:     Conjunctiva/sclera: Conjunctivae normal.     Pupils: Pupils are equal, round, and reactive to light.  Cardiovascular:     Rate and Rhythm: Bradycardia present.     Comments: Heart rate 52 Pulmonary:     Effort: Pulmonary effort is normal.  Musculoskeletal:     Cervical back: Normal range of motion and neck supple.  Lymphadenopathy:     Cervical: No cervical adenopathy.  Skin:    General: Skin is warm and dry.  Neurological:     General: No focal deficit present.     Mental Status: She is alert and oriented to person, place, and time.  Psychiatric:        Mood and Affect: Mood normal.        Behavior: Behavior normal.      UC Treatments / Results  Labs (all labs ordered are listed, but only abnormal results are displayed) Labs Reviewed - No data to display  EKG   Radiology No results found.  Procedures Procedures (including critical care  time)  Medications Ordered in UC Medications -  No data to display  Initial Impression / Assessment and Plan / UC Course  I have reviewed the triage vital signs and the nursing notes.  Pertinent labs & imaging results that were available during my care of the patient were reviewed by me and considered in my medical decision making (see chart for details).     Reviewed exam and symptoms with patient.  No red flags.  Discussed eustachian tube dysfunction.  Start prednisone  and Flonase .  Advised PCP follow-up if symptoms do not improve.  ER precautions reviewed. Final Clinical Impressions(s) / UC Diagnoses   Final diagnoses:  Eustachian tube dysfunction, right     Discharge Instructions      Start Flonase  daily.  May also take prednisone  daily for 5 days.  Please follow-up with your PCP if your symptoms do not improve.  Please go to the ER if you develop any worsening symptoms.  Hope you feel better soon!    ED Prescriptions     Medication Sig Dispense Auth. Provider   fluticasone  (FLONASE ) 50 MCG/ACT nasal spray Place 1 spray into both nostrils daily. 15.8 mL Eileen Croswell, Jodi R, NP   predniSONE  (DELTASONE ) 20 MG tablet Take 2 tablets (40 mg total) by mouth daily with breakfast for 5 days. 10 tablet Avant Printy, Jodi R, NP      PDMP not reviewed this encounter.   Loreda Myla SAUNDERS, NP 02/23/24 1153

## 2024-02-23 NOTE — Progress Notes (Deleted)
 Chief Complaint:colon cancer screening Primary GI Doctor:***  HPI:  Patient is a  48  year old female patient with past medical history of anxiety,depression, and hypertension*****who was referred to me by Howell Lunger, DO on 08/18/23 for a evaluation of colon cancer screening .    Interval History  Patient admits/denies GERD Patient admits/denies dysphagia Patient admits/denies nausea, vomiting, or weight loss  Patient admits/denies altered bowel habits Patient admits/denies abdominal pain Patient admits/denies rectal bleeding   Denies/Admits alcohol Denies/Admits smoking Denies/Admits NSAID use. Denies/Admits they are on blood thinners.  Patient on Wegovy   Patients last colonoscopy Patients last EGD  Surgical history:  Patient's family history includes  Wt Readings from Last 3 Encounters:  10/13/23 228 lb (103.4 kg)  09/01/23 221 lb (100.2 kg)  08/18/23 221 lb (100.2 kg)      Past Medical History:  Diagnosis Date   Anxiety    Depression    Hypertension    PVC's (premature ventricular contractions)     Past Surgical History:  Procedure Laterality Date   CESAREAN SECTION     x 2   CHEILECTOMY Left 09/03/2023   Procedure: CHEILECTOMY;  Surgeon: Tobie Buckles, DPM;  Location: [facility];  Service: Podiatry;  Laterality: Left;   wisdom teeth removal      Current Outpatient Medications  Medication Sig Dispense Refill   doxycycline  (VIBRAMYCIN ) 100 MG capsule Take 1 capsule (100 mg total) by mouth 2 (two) times daily. 20 capsule 0   fluconazole  (DIFLUCAN ) 150 MG tablet Take 1 tablet (150 mg total) by mouth every 3 (three) days. 3 tablet 0   losartan  (COZAAR ) 25 MG tablet Take 1 tablet (25 mg total) by mouth daily. 30 tablet 11   oxyCODONE -acetaminophen  (PERCOCET/ROXICET) 5-325 MG tablet Take 1 tablet by mouth every 4 (four) hours as needed for severe pain (pain score 7-10).     Semaglutide -Weight Management (WEGOVY ) 1.7 MG/0.75ML SOAJ INJECT 1.7 MG INTO  THE SKIN ONCE A WEEK. 3 mL 1   sertraline  (ZOLOFT ) 50 MG tablet TAKE 1 TABLET BY MOUTH EVERY DAY 90 tablet 1   No current facility-administered medications for this visit.    Allergies as of 02/23/2024 - Review Complete 12/23/2023  Allergen Reaction Noted   Sulfa antibiotics Swelling and Rash 09/13/2012    Family History  Problem Relation Age of Onset   Kidney cancer Mother    Hypertension Mother     Review of Systems:    Constitutional: No weight loss, fever, chills, weakness or fatigue HEENT: Eyes: No change in vision               Ears, Nose, Throat:  No change in hearing or congestion Skin: No rash or itching Cardiovascular: No chest pain, chest pressure or palpitations   Respiratory: No SOB or cough Gastrointestinal: See HPI and otherwise negative Genitourinary: No dysuria or change in urinary frequency Neurological: No headache, dizziness or syncope Musculoskeletal: No new muscle or joint pain Hematologic: No bleeding or bruising Psychiatric: No history of depression or anxiety    Physical Exam:  Vital signs: There were no vitals taken for this visit.  Constitutional:   Pleasant *** female/female appears to be in NAD, Well developed, Well nourished, alert and cooperative Eyes:   PEERL, EOMI. No icterus. Conjunctiva pink. Neck:  Supple Throat: Oral cavity and pharynx without inflammation, swelling or lesion.  Respiratory: Respirations even and unlabored. Lungs clear to auscultation bilaterally.   No wheezes, crackles, or rhonchi.  Cardiovascular: Normal S1, S2. Regular  rate and rhythm. No peripheral edema, cyanosis or pallor.  Gastrointestinal:  Soft, nondistended, nontender. No rebound or guarding. Normal bowel sounds. No appreciable masses or hepatomegaly. Rectal:  Not performed.  Anoscopy: Msk:  Symmetrical without gross deformities. Without edema, no deformity or joint abnormality.  Neurologic:  Alert and  oriented x4;  grossly normal neurologically.  Skin:   Dry  and intact without significant lesions or rashes.  RELEVANT LABS AND IMAGING: CBC    Latest Ref Rng & Units 08/17/2020   12:39 PM 04/04/2008    8:22 PM  CBC  WBC 4.0 - 10.5 K/uL 6.9  7.2   Hemoglobin 12.0 - 15.0 g/dL 85.0  85.8   Hematocrit 36.0 - 46.0 % 46.0  42.6   Platelets 150 - 400 K/uL 268  261      CMP     Latest Ref Rng & Units 09/01/2023    9:27 AM 06/20/2021    3:42 PM 08/17/2020   12:39 PM  CMP  Glucose 70 - 99 mg/dL 60  84  92   BUN 6 - 20 mg/dL 9  8  8    Creatinine 0.44 - 1.00 mg/dL 9.19  9.28  9.16   Sodium 135 - 145 mmol/L 139  141  139   Potassium 3.5 - 5.1 mmol/L 3.5  4.9  4.3   Chloride 98 - 111 mmol/L 107  105  104   CO2 22 - 32 mmol/L 24  24  26    Calcium 8.9 - 10.3 mg/dL 9.1  9.5  9.4   Total Protein 6.5 - 8.1 g/dL   7.5   Total Bilirubin 0.3 - 1.2 mg/dL   1.0   Alkaline Phos 38 - 126 U/L   53   AST 15 - 41 U/L   13   ALT 0 - 44 U/L   13      Lab Results  Component Value Date   TSH 1.054 04/04/2008  04/22/2022 echo-  Left ventricular ejection fraction, by estimation, is 65 to 70%.   Assessment: 1. ***  Plan: 1. ***   Thank you for the courtesy of this consult. Please call me with any questions or concerns.   Annette Liotta, FNP-C [facility] 02/23/2024, 6:36 AM  Cc: Howell Lunger, DO

## 2024-02-23 NOTE — ED Triage Notes (Signed)
 Pt c/o right ear pain that radiates to right side of throatx1wk. PT states noticed red dot and thrust on right side of throat this morning. PT states she can hear her right ear draining and hears a smacking noise.

## 2024-02-23 NOTE — Discharge Instructions (Addendum)
 Start Flonase  daily.  May also take prednisone  daily for 5 days.  Please follow-up with your PCP if your symptoms do not improve.  Please go to the ER if you develop any worsening symptoms.  Hope you feel better soon!

## 2024-03-03 ENCOUNTER — Ambulatory Visit: Admitting: Psychology

## 2024-03-17 ENCOUNTER — Ambulatory Visit: Admitting: Psychology

## 2024-04-26 ENCOUNTER — Ambulatory Visit: Admitting: Gastroenterology

## 2024-04-26 NOTE — Progress Notes (Deleted)
 Briana Rodgers

## 2024-06-09 ENCOUNTER — Telehealth: Payer: Self-pay

## 2024-06-09 NOTE — Telephone Encounter (Signed)
 Patient LVM on nurse line yesterday afternoon requesting assistance with Wegovy  prescription.   She reports that insurance is no longer covering Wegovy  and is asking if we can provide med assistance with this.   Attempted to call patient back, she did not answer, VM box full.   When patient calls back, please assist her in scheduling follow up with PCP for weight management.   Chiquita JAYSON English, RN

## 2024-06-10 NOTE — Telephone Encounter (Signed)
 Patient returns call to nurse line.   She reports she is getting new insurance in January. She reports she believes it will be Community Education [employer]. However, she has Medicaid until then.   Advised Medicaid will no longer cover Wegovy , no exceptions.   She is requesting something else to help with weight loss in the meantime, something she can start now with Medicaid and transition over to [place] in 2026.  Patient advised to schedule an apt. She reports she will need to call back once she looks over her schedule.

## 2024-07-30 ENCOUNTER — Encounter: Payer: Self-pay | Admitting: Student

## 2024-07-30 ENCOUNTER — Ambulatory Visit: Payer: Self-pay | Admitting: Student

## 2024-07-30 VITALS — BP 122/81 | HR 61 | Ht 64.0 in | Wt 241.1 lb

## 2024-07-30 DIAGNOSIS — Z6841 Body Mass Index (BMI) 40.0 and over, adult: Secondary | ICD-10-CM

## 2024-07-30 DIAGNOSIS — Z1231 Encounter for screening mammogram for malignant neoplasm of breast: Secondary | ICD-10-CM | POA: Diagnosis not present

## 2024-07-30 DIAGNOSIS — Z0000 Encounter for general adult medical examination without abnormal findings: Secondary | ICD-10-CM

## 2024-07-30 DIAGNOSIS — Z975 Presence of (intrauterine) contraceptive device: Secondary | ICD-10-CM | POA: Insufficient documentation

## 2024-07-30 DIAGNOSIS — S76312A Strain of muscle, fascia and tendon of the posterior muscle group at thigh level, left thigh, initial encounter: Secondary | ICD-10-CM

## 2024-07-30 DIAGNOSIS — Z1211 Encounter for screening for malignant neoplasm of colon: Secondary | ICD-10-CM | POA: Diagnosis not present

## 2024-07-30 MED ORDER — WEGOVY 0.25 MG/0.5ML ~~LOC~~ SOAJ: 0.2500 mg | SUBCUTANEOUS | 0 refills | Status: AC

## 2024-07-30 MED ORDER — MELOXICAM 15 MG PO TABS: 15.0000 mg | ORAL_TABLET | Freq: Every day | ORAL | 0 refills | Status: AC

## 2024-07-30 NOTE — Progress Notes (Signed)
" ° ° °  SUBJECTIVE:   Chief compliant/HPI: annual examination  Briana Rodgers is a 49 y.o. who presents today for an annual exam.   History tabs reviewed and updated.   Additional concerns: Obesity Patient previously on Wegovy  for weight loss management.  Due to insurance lapse has not had Wegovy  since October 2025.  She continues to diet and exercise.  OBJECTIVE:   BP 122/81   Pulse 61   Ht 5' 4 (1.626 m)   Wt 241 lb 2 oz (109.4 kg)   SpO2 99%   BMI 41.39 kg/m    General: NAD, pleasant Cardio: RRR, no MRG.  Respiratory: CTAB, normal wob on RA Left hip/leg: No gross from the, no ecchymosis, no bruising.  TTP over proximal insertion of hamstring.  FADIR/FABER negative.  Pain worse with resisted hip extension.  ASSESSMENT/PLAN:   Assessment & Plan Annual physical exam Blood pressure reviewed and at goal.  Asked about intimate partner violence and resources given as appropriate  The patient currently uses Mirena for contraception. F  Considered the following items based upon USPSTF recommendations: Diabetes screening: ordered HIV testing: Discussed not ordered Hepatitis C: Discussed not ordered Hepatitis B: Ordered Syphilis if at high risk: Not high risk GC/CT not high risk Lipid panel (nonfasting or fasting) discussed based upon AHA recommendations and ordered.  Consider repeat every 4-6 years.   Cervical cancer screening: prior Pap reviewed, repeat due in November 2027 Breast cancer screening: ordered Colorectal cancer screening: discussed options, elected for cologuard if age 22 or over.   Follow up in 1  year or sooner if indicated.  MyChart Activation: Already signed up BMI 40.0-44.9, adult (HCC) -Previous 59 pound total weight loss on Wegovy , however due to insurance lapse has not had Wegovy  since October.  Unfortunately regained 20 pounds this past year. Interested in restarting Wegovy .  She has a BMI greater than 40, with prediabetes. - Wegovy  0.25 mg weekly for 4  weeks, then titrate to 0.5 mg weekly for 4 weeks - Continue last modifications - Follow-up in 2 months or sooner Strain of left hamstring, initial encounter - History and exam suggestive of hamstring strain - Trial of Mobic  for 7 days, counseled - Follow-up if symptoms do not improve or worsen   Gladis Church, DO [facility]  "

## 2024-07-30 NOTE — Assessment & Plan Note (Signed)
-  Previous 59 pound total weight loss on Wegovy , however due to insurance lapse has not had Wegovy  since October.  Unfortunately regained 20 pounds this past year. Interested in restarting Wegovy .  She has a BMI greater than 40, with prediabetes. - Wegovy  0.25 mg weekly for 4 weeks, then titrate to 0.5 mg weekly for 4 weeks - Continue last modifications - Follow-up in 2 months or sooner

## 2024-07-30 NOTE — Patient Instructions (Signed)
 It was great to see you! Thank you for allowing me to participate in your care!   I recommend that you always bring your medications to each appointment as this makes it easy to ensure we are on the correct medications and helps us  not miss when refills are needed.  Our plans for today:  -Take 15 mg of Mobic  (meloxicam ) once daily for 7 days, you can take Tylenol .  Do not take any other anti-inflammatory such as ibuprofen /naproxen /Aleve  while taking Mobic .  Follow-up in a week if symptoms are not improving. - I have refilled your Wegovy , if a prior authorization is required we will attempt prior authorization and keep you informed. - We are checking some labs today, I will call you if they are abnormal will send you a MyChart message or a letter if they are normal.  If you do not hear about your labs in the next 2 weeks please let us  know. - Additionally we will see if Cologuard in the mail, please provide sample and mail back  - I recommend you undergo a mammogram.  You can call to schedule an appointment by calling [phone]. Directions [address] [place], [place] [address]  Please let me know if you have questions. I will send you a letter or call you with results.    Take care and seek immediate care sooner if you develop any concerns. Please remember to show up 15 minutes before your scheduled appointment time!  Gladis Church, DO [facility]

## 2024-07-31 ENCOUNTER — Ambulatory Visit: Payer: Self-pay | Admitting: Student

## 2024-07-31 LAB — BASIC METABOLIC PANEL WITH GFR
BUN/Creatinine Ratio: 12 (ref 9–23)
BUN: 10 mg/dL (ref 6–24)
CO2: 23 mmol/L (ref 20–29)
Calcium: 9.3 mg/dL (ref 8.7–10.2)
Chloride: 105 mmol/L (ref 96–106)
Creatinine, Ser: 0.83 mg/dL (ref 0.57–1.00)
Glucose: 83 mg/dL (ref 70–99)
Potassium: 4.3 mmol/L (ref 3.5–5.2)
Sodium: 139 mmol/L (ref 134–144)
eGFR: 88 mL/min/1.73

## 2024-07-31 LAB — HEPATITIS B SURFACE ANTIGEN: Hepatitis B Surface Ag: NEGATIVE

## 2024-07-31 LAB — LIPID PANEL
Chol/HDL Ratio: 2.5 ratio (ref 0.0–4.4)
Cholesterol, Total: 165 mg/dL (ref 100–199)
HDL: 65 mg/dL
LDL Chol Calc (NIH): 89 mg/dL (ref 0–99)
Triglycerides: 54 mg/dL (ref 0–149)
VLDL Cholesterol Cal: 11 mg/dL (ref 5–40)

## 2024-07-31 LAB — HEPATITIS B SURFACE ANTIBODY, QUANTITATIVE: Hepatitis B Surf Ab Quant: <3.5 m[IU]/mL — ABNORMAL LOW

## 2024-07-31 LAB — HEMOGLOBIN A1C
Est. average glucose Bld gHb Est-mCnc: 108 mg/dL
Hgb A1c MFr Bld: 5.4 % (ref 4.8–5.6)

## 2024-07-31 LAB — HEPATITIS B CORE ANTIBODY, TOTAL: Hep B Core Total Ab: NEGATIVE

## 2024-08-04 ENCOUNTER — Other Ambulatory Visit (HOSPITAL_COMMUNITY): Payer: Self-pay

## 2024-08-06 ENCOUNTER — Other Ambulatory Visit: Payer: Self-pay | Admitting: Student

## 2024-08-09 ENCOUNTER — Other Ambulatory Visit (HOSPITAL_COMMUNITY): Payer: Self-pay

## 2024-08-09 ENCOUNTER — Telehealth: Payer: Self-pay

## 2024-08-09 NOTE — Telephone Encounter (Signed)
 Pharmacy Patient Advocate Encounter   Received notification from [employer] that prior authorization for Wegovy  0.25MG /0.5ML auto-injectors is required/requested.   Insurance verification completed.   The patient is insured through [facility].   Per test claim: PA required; PA submitted to above mentioned insurance via Latent Key/confirmation #/EOC [id number] Status is pending

## 2024-08-09 NOTE — Telephone Encounter (Signed)
 Pharmacy Patient Advocate Encounter  Received notification from [facility] that Prior Authorization for Wegovy  0.25MG /0.5ML auto-injectors has been APPROVED from 08/09/24 to 03/09/25. Ran test claim, Copay is $45. This test claim was processed through [facility]- copay amounts may vary at other pharmacies due to pharmacy/plan contracts, or as the patient moves through the different stages of their insurance plan.   PA #/Case ID/Reference #: [id number]

## 2024-08-13 ENCOUNTER — Other Ambulatory Visit (HOSPITAL_COMMUNITY): Payer: Self-pay

## 2024-08-19 ENCOUNTER — Ambulatory Visit

## 2024-08-24 ENCOUNTER — Ambulatory Visit

## 2024-09-08 ENCOUNTER — Ambulatory Visit
